# Patient Record
Sex: Female | Born: 1985 | Hispanic: Yes | Marital: Married | Smoking: Never smoker
Health system: Southern US, Community
[De-identification: ages and names within clinical notes are randomized; demographics above are authoritative.]

## PROBLEM LIST (undated history)

## (undated) DIAGNOSIS — N390 Urinary tract infection, site not specified: Secondary | ICD-10-CM

## (undated) HISTORY — DX: Urinary tract infection, site not specified: N39.0

## (undated) HISTORY — PX: CHOLECYSTECTOMY: SHX55

---
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
## 2008-07-21 ENCOUNTER — Inpatient Hospital Stay: Payer: Self-pay | Admitting: Surgery

## 2009-09-09 ENCOUNTER — Ambulatory Visit: Payer: Self-pay | Admitting: Family Medicine

## 2009-09-15 ENCOUNTER — Encounter: Payer: Self-pay | Admitting: Obstetrics and Gynecology

## 2010-01-07 ENCOUNTER — Observation Stay: Payer: Self-pay | Admitting: Obstetrics and Gynecology

## 2010-02-05 ENCOUNTER — Inpatient Hospital Stay: Payer: Self-pay | Admitting: Obstetrics and Gynecology

## 2014-03-28 ENCOUNTER — Encounter: Payer: Self-pay | Admitting: Maternal & Fetal Medicine

## 2014-04-05 NOTE — L&D Delivery Note (Signed)
CTSP and NSVD of viable female in OA position with no CAN. Vtx, Ant and Post shoulder delivered over perineum and to mom's abd. at 0553. Baby cried initially. No  Laceration identified. CCx2 and cut per FOB. Cord blood obtained. SDOP intact at 0600.  Am/pm. Apgars  8-9  . EBL 100  Ml's. VSS. Hemostasis achieved.  Needle and sponge ct correct. Bonding with baby and skin to skin initially.

## 2014-05-06 ENCOUNTER — Encounter: Payer: Self-pay | Admitting: Obstetrics and Gynecology

## 2014-07-07 ENCOUNTER — Observation Stay
Admit: 2014-07-07 | Disposition: A | Discharge status: Home / Self Care | Payer: Self-pay | Attending: Obstetrics and Gynecology | Admitting: Obstetrics and Gynecology

## 2014-07-07 LAB — COMPREHENSIVE METABOLIC PANEL
Albumin: 3.6 g/dL
Alkaline Phosphatase: 71 U/L
Anion Gap: 11 (ref 7–16)
BUN: 7 mg/dL
Bilirubin,Total: 0.6 mg/dL
CALCIUM: 8.4 mg/dL — AB
Chloride: 105 mmol/L
Co2: 20 mmol/L — ABNORMAL LOW
Creatinine: 0.44 mg/dL
GLUCOSE: 88 mg/dL
POTASSIUM: 2.8 mmol/L — AB
SGOT(AST): 26 U/L
SGPT (ALT): 28 U/L
SODIUM: 136 mmol/L
TOTAL PROTEIN: 6.9 g/dL

## 2014-07-07 LAB — URINALYSIS, COMPLETE
BILIRUBIN, UR: NEGATIVE
BLOOD: NEGATIVE
Glucose,UR: NEGATIVE mg/dL (ref 0–75)
LEUKOCYTE ESTERASE: NEGATIVE
Nitrite: NEGATIVE
PH: 8 (ref 4.5–8.0)
Protein: NEGATIVE
RBC,UR: NONE SEEN /HPF (ref 0–5)
SPECIFIC GRAVITY: 1.005 (ref 1.003–1.030)
Squamous Epithelial: 1
WBC UR: 1 /HPF (ref 0–5)

## 2014-07-07 LAB — CBC WITH DIFFERENTIAL/PLATELET
Basophil #: 0 10*3/uL (ref 0.0–0.1)
Basophil %: 0.2 %
EOS PCT: 0.2 %
Eosinophil #: 0 10*3/uL (ref 0.0–0.7)
HCT: 36 % (ref 35.0–47.0)
HGB: 12.4 g/dL (ref 12.0–16.0)
LYMPHS ABS: 0.9 10*3/uL — AB (ref 1.0–3.6)
Lymphocyte %: 10.9 %
MCH: 30.7 pg (ref 26.0–34.0)
MCHC: 34.4 g/dL (ref 32.0–36.0)
MCV: 89 fL (ref 80–100)
MONO ABS: 0.3 x10 3/mm (ref 0.2–0.9)
Monocyte %: 3.2 %
NEUTROS PCT: 85.5 %
Neutrophil #: 6.8 10*3/uL — ABNORMAL HIGH (ref 1.4–6.5)
Platelet: 169 10*3/uL (ref 150–440)
RBC: 4.03 10*6/uL (ref 3.80–5.20)
RDW: 14.5 % (ref 11.5–14.5)
WBC: 8 10*3/uL (ref 3.6–11.0)

## 2014-07-27 NOTE — Consult Note (Signed)
Referral Information:  Reason for Referral 29 yo gravida 3 para 2002 at 7w6dgestation by LMP and ultrasound today is referred for recommendations regarding a purported history of severe preeclampsia in her previous pregnancy.  The patient denies ever being told she had preeclampsia.   Referring Physician [facility]   Prenatal Hx First ultrasound today.  She met with the genetic counselor and declined first trimester screening   Past Obstetrical Hx 1. 01/05/2004 - spontaneous vaginal delivery 5 lb 13 oz female infant at 317weeks gestation - [facility] 2. 02/06/2010 - spontaneous vaginal delivery 6 lb 14 oz female infant at 363weeks gestation - [facility] - induced for severe inguinal pain;  A review of the delivery record reveals NO EVIDENCE OF PREECLAMPSIA   Home Medications: Medication Instructions Status  prenatal multivitamin 1   once a day  Active  ferrous sulfate 1 tab(s)  once a day for 6 weeks Active   Allergies:   No Known Allergies:   Vital Signs/Notes:  Nursing Vital Signs: **Vital Signs.:   24-Dec-15 08:52  Vital Signs Type Routine  Temperature Temperature (F) 98.6  Celsius 37  Temperature Source oral  Pulse Pulse 91  Respirations Respirations 18  Systolic BP Systolic BP 1161 Diastolic BP (mmHg) Diastolic BP (mmHg) 76  Mean BP 93  Pulse Ox % Pulse Ox % 99  Pulse Ox Activity Level  At rest  Oxygen Delivery Room Air/ 21 %   Perinatal Consult:  LMP 28-Dec-2013   PGyn Hx Benign   PMed Hx Rubella Equivocal   Past Medical History cont'd Benign   PSurg Hx Choleceystectomy 2008   FHx Noncontributory   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  Fetal Anatomy:  Skull / Brain: appears normal. Spine: appears normal. Heart: appears normal.  Abdomen: appears normal. Stomach: visible. Bladder: visible. Hands: both  visible. Feet: both visible.     ____________________________________________________________________________  Maternal Structures:  Uterus and adnexa normal.   Uterus: normal.  Cervix: normal.  Right Ovary: normal.   Left Ovary: normal.  ____________________________________________________________________________  Report Summary:  Impression: Single intrauterine pregnancy with an estimated gestational age of   144 weeks6 days.  Dating assigned by LMP concordant with today's  ultrasound.    The patietn was. CODING DESCRIPTION:   Recommendations:     Thank you for allowing [place] participate in her care.  Electronically signed by:  AErasmo Score MD   Impression/Recommendations:  Impression 29yo gravida 3 para 2002 at 140w6destation by LMP and ultrasound today is referred for recommendations regarding a purported history of severe preeclampsia in her previous pregnancy.  The patient denies ever being told she had preeclampsia. There is no evidence from her previous delivery of preeclampsia   Recommendations 1. No special monitoring this pregnancy 2. Low-dose ASA would be optional in a low-risk pregnancy 3. The patient was scheduled to return  in 6 weeks for anatomy ultrasound.   Plan:  Genetic Counseling yes    Comments Thank you for allowing [place] to participate in her care.    Total Time Spent with Patient 30 minutes   >50% of visit spent in couseling/coordination of care yes   Office Use Only [id number]  Level 2 (60mn) NEW office consult exp prob focused   Coding Description: MATERNAL CONDITIONS/HISTORY INDICATION(S).   OTHER:  severe inguinal pain in a previous pregnancy.  Electronic Signatures: JDellia Nims(MD)  (Signed 24-Dec-15 13:18)  Authored: Referral, Home Medications, Allergies, Vital Signs/Notes, Consult, Exam, Radiology, Impression, Plan, Other Comments, Billing, Coding Description   Last Updated: 24-Dec-15 13:18 by JDellia Nims(MD)

## 2014-09-30 ENCOUNTER — Inpatient Hospital Stay
Admission: EM | Admit: 2014-09-30 | Discharge: 2014-10-02 | DRG: 775 | Disposition: A | Discharge status: Home / Self Care | Payer: Medicaid Other | Attending: Obstetrics and Gynecology | Admitting: Obstetrics and Gynecology

## 2014-09-30 DIAGNOSIS — Z3A39 39 weeks gestation of pregnancy: Secondary | ICD-10-CM | POA: Diagnosis present

## 2014-09-30 NOTE — H&P (Signed)
Jenna Daniel is a 29 y.o. female presenting for labor S/S. No ROM, no VB, no decreased FM. PNC at [facility] significant for LMP of 10/04/14 & EDD of 10/04/14  Maternal Medical History:  Reason for admission: Contractions.   Contractions: Onset was 3-5 hours ago.    Fetal activity: Perceived fetal activity is normal.   Last perceived fetal movement was within the past hour.    Prenatal complications: No bleeding, cholelithiasis, PIH, infection, IUGR, nephrolithiasis, oligohydramnios, placental abnormality, polyhydramnios, pre-eclampsia, preterm labor, substance abuse, thrombocytopenia or thrombophilia.   Prenatal Complications - Diabetes: none.    OB History    No data available     PMH: UTI,  BMI 28.6  [facility]  Family History: TB, pulmonary disorders, diabetes, cancer, multiple births, DM: MGM-Type 2, Cancer MGM.Marland Kitchen. borther with asthma, TB in family.  Social History:  has no tobacco, alcohol, and drug history on file.   Prenatal Transfer Tool  Maternal Diabetes: No Genetic Screening: none found on chart Maternal Ultrasounds/Referrals: Normal Fetal Ultrasounds or other Referrals:  None Maternal Substance Abuse:  No Significant Maternal Medications:  None Significant Maternal Lab Results:  None Other Comments:  GBs neg  ROS  Benign  9  Dilation: 4 Effacement (%): 80 Station: -2 Exam by:: T.Roberts RN Height 5\' 4"  (1.626 m), weight 81.647 kg (180 lb). Exam Physical Exam  Prenatal labs: ABO, Rh:  B pos Antibody:  Neg Rubella:  Immune RPR:   Non-REactive HBsAg:   Neg HIV:   Neg GBS:   neg Varicella: Non-immune  Assessment/Plan: A: 1. IUP at 39 5/7 weeks 2. GBS neg P:1. Admit for Labor.  2. GBS neg.   3. Monitor UC/FHT  Jenna Daniel, Jenna Daniel 09/30/2014, 11:43 PM

## 2014-10-01 ENCOUNTER — Other Ambulatory Visit: Payer: Self-pay | Admitting: Obstetrics and Gynecology

## 2014-10-01 DIAGNOSIS — Z3A39 39 weeks gestation of pregnancy: Secondary | ICD-10-CM | POA: Diagnosis present

## 2014-10-01 DIAGNOSIS — T7840XA Allergy, unspecified, initial encounter: Secondary | ICD-10-CM | POA: Diagnosis present

## 2014-10-01 DIAGNOSIS — Y9289 Other specified places as the place of occurrence of the external cause: Secondary | ICD-10-CM | POA: Diagnosis not present

## 2014-10-01 DIAGNOSIS — Z79899 Other long term (current) drug therapy: Secondary | ICD-10-CM | POA: Diagnosis not present

## 2014-10-01 DIAGNOSIS — Y998 Other external cause status: Secondary | ICD-10-CM | POA: Diagnosis not present

## 2014-10-01 DIAGNOSIS — Y9389 Activity, other specified: Secondary | ICD-10-CM | POA: Diagnosis not present

## 2014-10-01 DIAGNOSIS — T50B95A Adverse effect of other viral vaccines, initial encounter: Secondary | ICD-10-CM | POA: Diagnosis not present

## 2014-10-01 LAB — TYPE AND SCREEN
ABO/RH(D): B POS
ANTIBODY SCREEN: NEGATIVE

## 2014-10-01 LAB — CBC
HEMATOCRIT: 36.2 % (ref 35.0–47.0)
Hemoglobin: 12.1 g/dL (ref 12.0–16.0)
MCH: 27.9 pg (ref 26.0–34.0)
MCHC: 33.6 g/dL (ref 32.0–36.0)
MCV: 83.1 fL (ref 80.0–100.0)
Platelets: 200 10*3/uL (ref 150–440)
RBC: 4.35 MIL/uL (ref 3.80–5.20)
RDW: 15.4 % — ABNORMAL HIGH (ref 11.5–14.5)
WBC: 9.2 10*3/uL (ref 3.6–11.0)

## 2014-10-01 MED ORDER — FLEET ENEMA 7-19 GM/118ML RE ENEM: 1.0000 | ENEMA | Freq: Every day | RECTAL | Status: DC | PRN

## 2014-10-01 MED ORDER — SODIUM CHLORIDE 0.9 % IJ SOLN: 3.0000 mL | INTRAMUSCULAR | Status: DC | PRN

## 2014-10-01 MED ORDER — SODIUM CHLORIDE 0.9 % IJ SOLN: 3.0000 mL | Freq: Two times a day (BID) | INTRAMUSCULAR | Status: DC

## 2014-10-01 MED ORDER — DIBUCAINE 1 % RE OINT: 1.0000 "application " | TOPICAL_OINTMENT | RECTAL | Status: DC | PRN

## 2014-10-01 MED ORDER — CITRIC ACID-SODIUM CITRATE 334-500 MG/5ML PO SOLN: 30.0000 mL | ORAL | Status: DC | PRN

## 2014-10-01 MED ORDER — DIPHENHYDRAMINE HCL 25 MG PO CAPS: 25.0000 mg | ORAL_CAPSULE | Freq: Four times a day (QID) | ORAL | Status: DC | PRN

## 2014-10-01 MED ORDER — SIMETHICONE 80 MG PO CHEW: 80.0000 mg | CHEWABLE_TABLET | ORAL | Status: DC | PRN

## 2014-10-01 MED ORDER — WITCH HAZEL-GLYCERIN EX PADS: 1.0000 "application " | MEDICATED_PAD | CUTANEOUS | Status: DC | PRN

## 2014-10-01 MED ORDER — AMMONIA AROMATIC IN INHA
RESPIRATORY_TRACT | Status: AC
  Filled 2014-10-01: qty 10

## 2014-10-01 MED ORDER — LACTATED RINGERS IV SOLN
INTRAVENOUS | Status: DC
  Administered 2014-10-01: 01:00:00 via INTRAVENOUS

## 2014-10-01 MED ORDER — LIDOCAINE HCL (PF) 1 % IJ SOLN
30.0000 mL | INTRAMUSCULAR | Status: DC | PRN
  Filled 2014-10-01: qty 30

## 2014-10-01 MED ORDER — LIDOCAINE HCL (PF) 1 % IJ SOLN
INTRAMUSCULAR | Status: AC
  Filled 2014-10-01: qty 30

## 2014-10-01 MED ORDER — OXYTOCIN 10 UNIT/ML IJ SOLN
INTRAMUSCULAR | Status: AC
  Filled 2014-10-01: qty 2

## 2014-10-01 MED ORDER — ZOLPIDEM TARTRATE 5 MG PO TABS: 5.0000 mg | ORAL_TABLET | Freq: Every evening | ORAL | Status: DC | PRN

## 2014-10-01 MED ORDER — IBUPROFEN 600 MG PO TABS
600.0000 mg | ORAL_TABLET | Freq: Four times a day (QID) | ORAL | Status: DC
  Administered 2014-10-01 – 2014-10-02 (×4): 600 mg via ORAL
  Filled 2014-10-01 (×4): qty 1

## 2014-10-01 MED ORDER — BUTORPHANOL TARTRATE 1 MG/ML IJ SOLN
1.0000 mg | INTRAMUSCULAR | Status: DC | PRN
  Administered 2014-10-01: 1 mg via INTRAVENOUS

## 2014-10-01 MED ORDER — OXYCODONE-ACETAMINOPHEN 5-325 MG PO TABS
2.0000 | ORAL_TABLET | ORAL | Status: DC | PRN
  Filled 2014-10-01: qty 2

## 2014-10-01 MED ORDER — BISACODYL 10 MG RE SUPP: 10.0000 mg | Freq: Every day | RECTAL | Status: DC | PRN

## 2014-10-01 MED ORDER — OXYTOCIN BOLUS FROM INFUSION: 500.0000 mL | INTRAVENOUS | Status: DC

## 2014-10-01 MED ORDER — BUTORPHANOL TARTRATE 1 MG/ML IJ SOLN
INTRAMUSCULAR | Status: AC
  Administered 2014-10-01: 1 mg via INTRAVENOUS
  Filled 2014-10-01: qty 1

## 2014-10-01 MED ORDER — ONDANSETRON HCL 4 MG/2ML IJ SOLN: 4.0000 mg | Freq: Four times a day (QID) | INTRAMUSCULAR | Status: DC | PRN

## 2014-10-01 MED ORDER — OXYTOCIN 40 UNITS IN LACTATED RINGERS INFUSION - SIMPLE MED
INTRAVENOUS | Status: AC
  Filled 2014-10-01: qty 1000

## 2014-10-01 MED ORDER — LANOLIN HYDROUS EX OINT: TOPICAL_OINTMENT | CUTANEOUS | Status: DC | PRN

## 2014-10-01 MED ORDER — BUTORPHANOL TARTRATE 1 MG/ML IJ SOLN
1.0000 mg | INTRAMUSCULAR | Status: DC | PRN
  Administered 2014-10-01: 2 mg via INTRAVENOUS

## 2014-10-01 MED ORDER — OXYCODONE-ACETAMINOPHEN 5-325 MG PO TABS: 1.0000 | ORAL_TABLET | ORAL | Status: DC | PRN

## 2014-10-01 MED ORDER — ACETAMINOPHEN 325 MG PO TABS: 650.0000 mg | ORAL_TABLET | ORAL | Status: DC | PRN

## 2014-10-01 MED ORDER — ONDANSETRON HCL 4 MG/2ML IJ SOLN: 4.0000 mg | INTRAMUSCULAR | Status: DC | PRN

## 2014-10-01 MED ORDER — LACTATED RINGERS IV SOLN: 500.0000 mL | INTRAVENOUS | Status: DC | PRN

## 2014-10-01 MED ORDER — SODIUM CHLORIDE 0.9 % IV SOLN: 250.0000 mL | INTRAVENOUS | Status: DC | PRN

## 2014-10-01 MED ORDER — OXYCODONE-ACETAMINOPHEN 5-325 MG PO TABS
2.0000 | ORAL_TABLET | ORAL | Status: DC | PRN
Start: 2014-10-01 — End: 2014-10-01

## 2014-10-01 MED ORDER — BENZOCAINE-MENTHOL 20-0.5 % EX AERO: 1.0000 "application " | INHALATION_SPRAY | CUTANEOUS | Status: DC | PRN

## 2014-10-01 MED ORDER — BUTORPHANOL TARTRATE 1 MG/ML IJ SOLN
INTRAMUSCULAR | Status: AC
  Administered 2014-10-01: 2 mg via INTRAVENOUS
  Filled 2014-10-01: qty 2

## 2014-10-01 MED ORDER — ONDANSETRON HCL 4 MG PO TABS: 4.0000 mg | ORAL_TABLET | ORAL | Status: DC | PRN

## 2014-10-01 MED ORDER — OXYCODONE-ACETAMINOPHEN 5-325 MG PO TABS
1.0000 | ORAL_TABLET | ORAL | Status: DC | PRN
  Administered 2014-10-01: 1 via ORAL

## 2014-10-01 MED ORDER — VARICELLA VIRUS VACCINE LIVE 1350 PFU/0.5ML IJ SUSR
0.5000 mL | Freq: Once | INTRAMUSCULAR | Status: AC
  Administered 2014-10-02: 0.5 mL via SUBCUTANEOUS
  Filled 2014-10-01: qty 0.5

## 2014-10-01 MED ORDER — OXYTOCIN 40 UNITS IN LACTATED RINGERS INFUSION - SIMPLE MED: 62.5000 mL/h | INTRAVENOUS | Status: DC | PRN

## 2014-10-01 MED ORDER — SENNOSIDES-DOCUSATE SODIUM 8.6-50 MG PO TABS
2.0000 | ORAL_TABLET | ORAL | Status: DC
  Administered 2014-10-01: 2 via ORAL
  Filled 2014-10-01: qty 2

## 2014-10-01 MED ORDER — PRENATAL MULTIVITAMIN CH
1.0000 | ORAL_TABLET | Freq: Every day | ORAL | Status: DC
  Administered 2014-10-01: 1 via ORAL
  Filled 2014-10-01: qty 1

## 2014-10-01 MED ORDER — OXYTOCIN 40 UNITS IN LACTATED RINGERS INFUSION - SIMPLE MED: 62.5000 mL/h | INTRAVENOUS | Status: DC

## 2014-10-01 MED ORDER — MISOPROSTOL 200 MCG PO TABS
ORAL_TABLET | ORAL | Status: AC
  Filled 2014-10-01: qty 4

## 2014-10-02 ENCOUNTER — Emergency Department: Payer: Medicaid Other

## 2014-10-02 ENCOUNTER — Encounter: Payer: Self-pay | Admitting: *Deleted

## 2014-10-02 ENCOUNTER — Emergency Department
Admission: EM | Admit: 2014-10-02 | Discharge: 2014-10-02 | Disposition: A | Discharge status: Home / Self Care | Payer: Medicaid Other | Attending: Emergency Medicine | Admitting: Emergency Medicine

## 2014-10-02 DIAGNOSIS — T50B95A Adverse effect of other viral vaccines, initial encounter: Secondary | ICD-10-CM | POA: Insufficient documentation

## 2014-10-02 DIAGNOSIS — T7840XA Allergy, unspecified, initial encounter: Secondary | ICD-10-CM

## 2014-10-02 DIAGNOSIS — Y9289 Other specified places as the place of occurrence of the external cause: Secondary | ICD-10-CM | POA: Insufficient documentation

## 2014-10-02 DIAGNOSIS — Y998 Other external cause status: Secondary | ICD-10-CM | POA: Insufficient documentation

## 2014-10-02 DIAGNOSIS — Z79899 Other long term (current) drug therapy: Secondary | ICD-10-CM | POA: Insufficient documentation

## 2014-10-02 DIAGNOSIS — Y9389 Activity, other specified: Secondary | ICD-10-CM | POA: Insufficient documentation

## 2014-10-02 LAB — RPR: RPR: NONREACTIVE

## 2014-10-02 LAB — CBC
HEMATOCRIT: 35 % (ref 35.0–47.0)
HEMOGLOBIN: 11.7 g/dL — AB (ref 12.0–16.0)
MCH: 27.7 pg (ref 26.0–34.0)
MCHC: 33.4 g/dL (ref 32.0–36.0)
MCV: 83 fL (ref 80.0–100.0)
Platelets: 194 10*3/uL (ref 150–440)
RBC: 4.21 MIL/uL (ref 3.80–5.20)
RDW: 15.5 % — ABNORMAL HIGH (ref 11.5–14.5)
WBC: 9.4 10*3/uL (ref 3.6–11.0)

## 2014-10-02 LAB — HIV ANTIBODY (ROUTINE TESTING W REFLEX): HIV Screen 4th Generation wRfx: NONREACTIVE

## 2014-10-02 MED ORDER — IPRATROPIUM-ALBUTEROL 0.5-2.5 (3) MG/3ML IN SOLN
3.0000 mL | Freq: Once | RESPIRATORY_TRACT | Status: AC
  Administered 2014-10-02: 3 mL via RESPIRATORY_TRACT

## 2014-10-02 MED ORDER — VARICELLA VIRUS VACCINE LIVE 1350 PFU/0.5ML IJ SUSR: 0.5000 mL | Freq: Once | INTRAMUSCULAR | Status: DC

## 2014-10-02 MED ORDER — RANITIDINE HCL 150 MG PO TABS: 150.0000 mg | ORAL_TABLET | Freq: Two times a day (BID) | ORAL | Status: DC

## 2014-10-02 MED ORDER — IPRATROPIUM-ALBUTEROL 0.5-2.5 (3) MG/3ML IN SOLN
RESPIRATORY_TRACT | Status: AC
  Administered 2014-10-02: 3 mL via RESPIRATORY_TRACT
  Filled 2014-10-02: qty 3

## 2014-10-02 MED ORDER — BISACODYL 10 MG RE SUPP: 10.0000 mg | Freq: Every day | RECTAL | Status: DC | PRN

## 2014-10-02 MED ORDER — FAMOTIDINE 20 MG PO TABS
ORAL_TABLET | ORAL | Status: AC
  Administered 2014-10-02: 40 mg via ORAL
  Filled 2014-10-02: qty 2

## 2014-10-02 MED ORDER — DIPHENHYDRAMINE HCL 12.5 MG/5ML PO ELIX
50.0000 mg | ORAL_SOLUTION | Freq: Once | ORAL | Status: AC
  Administered 2014-10-02: 50 mg via ORAL

## 2014-10-02 MED ORDER — IBUPROFEN 600 MG PO TABS: 600.0000 mg | ORAL_TABLET | Freq: Four times a day (QID) | ORAL | Status: DC

## 2014-10-02 MED ORDER — IPRATROPIUM-ALBUTEROL 0.5-2.5 (3) MG/3ML IN SOLN: 3.0000 mL | Freq: Once | RESPIRATORY_TRACT | Status: AC

## 2014-10-02 MED ORDER — FAMOTIDINE 20 MG PO TABS
40.0000 mg | ORAL_TABLET | Freq: Once | ORAL | Status: AC
  Administered 2014-10-02: 40 mg via ORAL

## 2014-10-02 MED ORDER — DIPHENHYDRAMINE HCL 50 MG PO CAPS
ORAL_CAPSULE | ORAL | Status: AC
  Filled 2014-10-02: qty 1

## 2014-10-02 MED ORDER — DIPHENHYDRAMINE HCL 12.5 MG/5ML PO ELIX
ORAL_SOLUTION | ORAL | Status: AC
  Administered 2014-10-02: 50 mg via ORAL
  Filled 2014-10-02: qty 20

## 2014-10-02 MED ORDER — IPRATROPIUM-ALBUTEROL 0.5-2.5 (3) MG/3ML IN SOLN
RESPIRATORY_TRACT | Status: AC
  Filled 2014-10-02: qty 3

## 2014-10-02 NOTE — ED Notes (Signed)
Pt was discharged from L&D after having a vaccination for varicella, pt having shortness of breath and difficulty breathing after having the vaccine

## 2014-10-02 NOTE — Discharge Instructions (Signed)

## 2014-10-02 NOTE — Discharge Instructions (Signed)
Allergies °Allergies may happen from anything your body is sensitive to. This may be food, medicines, pollens, chemicals, and nearly anything around you in everyday life that produces allergens. An allergen is anything that causes an allergy producing substance. Heredity is often a factor in causing these problems. This means you may have some of the same allergies as your parents. °Food allergies happen in all age groups. Food allergies are some of the most severe and life threatening. Some common food allergies are cow's milk, seafood, eggs, nuts, wheat, and soybeans. °SYMPTOMS  °· Swelling around the mouth. °· An itchy red rash or hives. °· Vomiting or diarrhea. °· Difficulty breathing. °SEVERE ALLERGIC REACTIONS ARE LIFE-THREATENING. °This reaction is called anaphylaxis. It can cause the mouth and throat to swell and cause difficulty with breathing and swallowing. In severe reactions only a trace amount of food (for example, peanut oil in a salad) may cause death within seconds. °Seasonal allergies occur in all age groups. These are seasonal because they usually occur during the same season every year. They may be a reaction to molds, grass pollens, or tree pollens. Other causes of problems are house dust mite allergens, pet dander, and mold spores. The symptoms often consist of nasal congestion, a runny itchy nose associated with sneezing, and tearing itchy eyes. There is often an associated itching of the mouth and ears. The problems happen when you come in contact with pollens and other allergens. Allergens are the particles in the air that the body reacts to with an allergic reaction. This causes you to release allergic antibodies. Through a chain of events, these eventually cause you to release histamine into the blood stream. Although it is meant to be protective to the body, it is this release that causes your discomfort. This is why you were given anti-histamines to feel better.  If you are unable to  pinpoint the offending allergen, it may be determined by skin or blood testing. Allergies cannot be cured but can be controlled with medicine. °Hay fever is a collection of all or some of the seasonal allergy problems. It may often be treated with simple over-the-counter medicine such as diphenhydramine. Take medicine as directed. Do not drink alcohol or drive while taking this medicine. Check with your caregiver or package insert for child dosages. °If these medicines are not effective, there are many new medicines your caregiver can prescribe. Stronger medicine such as nasal spray, eye drops, and corticosteroids may be used if the first things you try do not work well. Other treatments such as immunotherapy or desensitizing injections can be used if all else fails. Follow up with your caregiver if problems continue. These seasonal allergies are usually not life threatening. They are generally more of a nuisance that can often be handled using medicine. °HOME CARE INSTRUCTIONS  °· If unsure what causes a reaction, keep a diary of foods eaten and symptoms that follow. Avoid foods that cause reactions. °· If hives or rash are present: °¨ Take medicine as directed. °¨ You may use an over-the-counter antihistamine (diphenhydramine) for hives and itching as needed. °¨ Apply cold compresses (cloths) to the skin or take baths in cool water. Avoid hot baths or showers. Heat will make a rash and itching worse. °· If you are severely allergic: °¨ Following a treatment for a severe reaction, hospitalization is often required for closer follow-up. °¨ Wear a medic-alert bracelet or necklace stating the allergy. °¨ You and your family must learn how to give adrenaline or use   an anaphylaxis kit.  If you have had a severe reaction, always carry your anaphylaxis kit or EpiPen with you. Use this medicine as directed by your caregiver if a severe reaction is occurring. Failure to do so could have a fatal outcome. SEEK MEDICAL  CARE IF:  You suspect a food allergy. Symptoms generally happen within 30 minutes of eating a food.  Your symptoms have not gone away within 2 days or are getting worse.  You develop new symptoms.  You want to retest yourself or your child with a food or drink you think causes an allergic reaction. Never do this if an anaphylactic reaction to that food or drink has happened before. Only do this under the care of a caregiver. SEEK IMMEDIATE MEDICAL CARE IF:   You have difficulty breathing, are wheezing, or have a tight feeling in your chest or throat.  You have a swollen mouth, or you have hives, swelling, or itching all over your body.  You have had a severe reaction that has responded to your anaphylaxis kit or an EpiPen. These reactions may return when the medicine has worn off. These reactions should be considered life threatening. MAKE SURE YOU:   Understand these instructions.  Will watch your condition.  Will get help right away if you are not doing well or get worse. Document Released: 06/15/2002 Document Revised: 07/17/2012 Document Reviewed: 11/20/2007 [facility] Patient Information 2015 [place], [place]. This information is not intended to replace advice given to you by your health care provider. Make sure you discuss any questions you have with your health care provider.  Please take 50 mg of benedryl 4 x a day for the next 2 days (thqt is 2 of the over the counter pills 4 x a day) be careful they may make you a little sleepy. Take the zantac pills 1 pill 2 x a day for 2 days. Use the albuterol inhaler  2 pufffs 4 x a day if needed. Call 911 and return if you get short af breath again or become very itchy again.

## 2014-10-02 NOTE — ED Notes (Signed)
Patient transported to CX[facility].

## 2014-10-02 NOTE — ED Provider Notes (Signed)
[facility] Emergency Department Provider Note   Time seen: Approximately 2:07 PM  I have reviewed the triage vital signs and the nursing notes.   HISTORY  Chief Complaint Allergic Reaction    HPI Jenna Daniel is a 29 y.o. female Patient just delivered, got varicella vaccine in the left upper outer arm just prior to discharge was discharged when she got down to the car the guy became itchy and wheezy and developed a stuffy nose. Patient came to the emergency room. Symptoms just started she has never had anything like this before has not had a history of allergies to any previous vaccination. Discussed with Dr. Feliberto GottronSchermerhorn the ability to continue breast-feeding using diphenhydramine H2 blockers and/or steroid-induced she said they're all compatible with breast-feeding.   History reviewed. No pertinent past medical history.  Patient Active Problem List   Diagnosis Date Noted  . Indication for care or intervention related to labor and delivery 10/01/2014  . Indication for care in labor or delivery 09/30/2014    History reviewed. No pertinent past surgical history.  Current Outpatient Rx  Name  Route  Sig  Dispense  Refill  . bisacodyl (DULCOLAX) 10 MG suppository   Rectal   Place 1 suppository (10 mg total) rectally daily as needed for moderate constipation.   12 suppository   0   . ibuprofen (ADVIL,MOTRIN) 600 MG tablet   Oral   Take 1 tablet (600 mg total) by mouth every 6 (six) hours.   30 tablet   0   . ranitidine (ZANTAC) 150 MG tablet   Oral   Take 1 tablet (150 mg total) by mouth 2 (two) times daily.   4 tablet   0   . varicella virus vaccine live (VARIVAX) 1350 PFU/0.5ML INJ injection   Subcutaneous   Inject 0.5 mLs into the skin once.   1 each   0     Allergies Review of patient's allergies indicates no known allergies.  No family history on file.  Social History History  Substance Use Topics  . Smoking status: Never  Smoker   . Smokeless tobacco: Not on file  . Alcohol Use: No    Review of Systems Constitutional: No fever/chills Eyes: No visual changes. ENT: No sore throat. Cardiovascular: Denies chest pain. Respiratory: see history of present illness Gastrointestinal: No abdominal pain.  No nausea, no vomiting.  No diarrhea.  No constipation. Genitourinary: Negative for dysuria. Musculoskeletal: Negative for back pain. Skin: patient has a fine for rash scattered over her arms and legs is she reports just started within the last 1520 minutes she shot was only received about a half an hour ago Neurological: Negative for headaches, focal weakness or numbness.  10-point ROS otherwise negative.  ____________________________________________   PHYSICAL EXAM:  VITAL SIGNS: ED Triage Vitals  Enc Vitals Group     BP 10/02/14 1350 124/72 mmHg     Pulse Rate 10/02/14 1350 78     Resp --      Temp 10/02/14 1350 98 F (36.7 C)     Temp Source 10/02/14 1350 Oral     SpO2 10/02/14 1350 98 %     Weight 10/02/14 1350 170 lb (77.111 kg)     Height 10/02/14 1350 5\' 3"  (1.6 m)     Head Cir --      Peak Flow --      Pain Score --      Pain Loc --      Pain  Edu? --      Excl. in GC? --     Constitutional: Alert and oriented. Well appearing and in no acute distress. Eyes: Conjunctivae are normal. PERRL. EOMI. Head: Atraumatic. Nose: is congested Mouth/Throat: Mucous membranes are moist.  Oropharynx non-erythematous. Neck: No stridor.  Cardiovascular: Normal rate, regular rhythm. Grossly normal heart sounds.  Good peripheral circulation. Respiratory: Normal respiratory effort.  Patient isn't wheezing. Gastrointestinal: Soft and nontender. No distention. No abdominal bruits. No CVA tenderness. Musculoskeletal: No lower extremity tenderness nor edema.  No joint effusions. Neurologic:  Normal speech and language. No gross focal neurologic deficits are appreciated. Speech is normal. No gait  instability. Skin:  Skin is warm, dry and intact. Rash as described in history of present illness Psychiatric: Mood and affect are normal. Speech and behavior are normal.  ____________________________________________   LABS (all labs ordered are listed, but only abnormal results are displayed)  ____________________________________________  EKG   ____________________________________________  RADIOLOGY   ___chest x-ray read as normal by radiologist_________________________________________   PROCEDURES  Procedure(s) performed:  Critical Care performed:  ____________________________________________   INITIAL IMPRESSION / ASSESSMENT AND PLAN / ED COURSE  Pertinent labs & imaging results that were available during my care of the patient were reviewed by me and considered in my medical decision making (see chart for details).  __after Benadryl and Pepcid and the albuterol nebs patient's lungs are clear patient feels much better she had some slight itching left I will discharge her with by mouth Benadryl and Zantac I will not give first her records at this point since she had the vaccine and since she also has a baby with her. I will give her an albuterol inhaler patient to return immediately by calling 911 if shortness of breath recurs or if she gets much worse again with the itching__________________________________________   FINAL CLINICAL IMPRESSION(S) / ED DIAGNOSES  Final diagnoses:  Allergic reaction, initial encounter      Arnaldo Natal, MD 10/02/14 2113

## 2014-10-02 NOTE — ED Notes (Signed)
Pt reports improvement in breathing with neb. 

## 2014-10-02 NOTE — ED Notes (Signed)
AAOx3.  Skin warm and dry.  Respirations regular and non labored.  Lungs are clear.  Patient states itching is much improved.  NAD.

## 2014-10-02 NOTE — ED Notes (Signed)
Dr Darnelle CatalanMalinda notified of difficulty breathing. At bedside to assess.

## 2014-10-02 NOTE — Discharge Summary (Signed)
Obstetric Discharge Summary Reason for Admission: onset of labor Prenatal Procedures: none Intrapartum Procedures: spontaneous vaginal delivery Postpartum Procedures: none Complications-Operative and Postpartum: none HEMOGLOBIN  Date Value Ref Range Status  10/02/2014 11.7* 12.0 - 16.0 g/dL Final   HGB  Date Value Ref Range Status  07/07/2014 12.4 12.0-16.0 g/dL Final   HCT  Date Value Ref Range Status  10/02/2014 35.0 35.0 - 47.0 % Final  07/07/2014 36.0 35.0-47.0 % Final    Physical Exam:  General: alert and cooperative Lochia: appropriate Uterine Fundus: firm Incision:  DVT Evaluation: No evidence of DVT seen on physical exam.  Discharge Diagnoses: Term Pregnancy-delivered  Discharge Information: Date: 10/02/2014 Activity: pelvic rest Diet: routine Medications: Ibuprofen Condition: stable Instructions: Discharge to: home Follow-up Information    [facility] weeks.   Why:  postpartum care   Contact information:   [address] [place] [id number] [phone]       Newborn Data: Live born female  Birth Weight:   APGAR: 8, 9  Home with mother. Varicella vaccine given before d/c  Jenna Daniel 10/02/2014, 10:49 AM

## 2014-10-02 NOTE — ED Notes (Signed)
Patient stable on d/c 

## 2018-04-11 ENCOUNTER — Other Ambulatory Visit: Payer: Self-pay

## 2018-04-11 ENCOUNTER — Encounter: Payer: Self-pay | Admitting: Emergency Medicine

## 2018-04-11 ENCOUNTER — Emergency Department
Admission: EM | Admit: 2018-04-11 | Discharge: 2018-04-11 | Disposition: A | Discharge status: Home / Self Care | Payer: Self-pay | Attending: Emergency Medicine | Admitting: Emergency Medicine

## 2018-04-11 ENCOUNTER — Emergency Department: Payer: Self-pay

## 2018-04-11 DIAGNOSIS — B9689 Other specified bacterial agents as the cause of diseases classified elsewhere: Secondary | ICD-10-CM | POA: Insufficient documentation

## 2018-04-11 DIAGNOSIS — Z3A11 11 weeks gestation of pregnancy: Secondary | ICD-10-CM | POA: Insufficient documentation

## 2018-04-11 DIAGNOSIS — Z79899 Other long term (current) drug therapy: Secondary | ICD-10-CM | POA: Insufficient documentation

## 2018-04-11 DIAGNOSIS — O4690 Antepartum hemorrhage, unspecified, unspecified trimester: Secondary | ICD-10-CM

## 2018-04-11 DIAGNOSIS — O26891 Other specified pregnancy related conditions, first trimester: Secondary | ICD-10-CM | POA: Insufficient documentation

## 2018-04-11 DIAGNOSIS — N760 Acute vaginitis: Secondary | ICD-10-CM

## 2018-04-11 DIAGNOSIS — R102 Pelvic and perineal pain: Secondary | ICD-10-CM | POA: Insufficient documentation

## 2018-04-11 DIAGNOSIS — O23591 Infection of other part of genital tract in pregnancy, first trimester: Secondary | ICD-10-CM | POA: Insufficient documentation

## 2018-04-11 DIAGNOSIS — O4691 Antepartum hemorrhage, unspecified, first trimester: Secondary | ICD-10-CM | POA: Insufficient documentation

## 2018-04-11 LAB — WET PREP, GENITAL
SPERM: NONE SEEN
Trich, Wet Prep: NONE SEEN
Yeast Wet Prep HPF POC: NONE SEEN

## 2018-04-11 LAB — HCG, QUANTITATIVE, PREGNANCY: hCG, Beta Chain, Quant, S: 95305 m[IU]/mL — ABNORMAL HIGH (ref <5)

## 2018-04-11 LAB — CHLAMYDIA/NGC RT PCR (ARMC ONLY)
CHLAMYDIA TR: NOT DETECTED
N gonorrhoeae: NOT DETECTED

## 2018-04-11 MED ORDER — METRONIDAZOLE 500 MG PO TABS: 500.0000 mg | ORAL_TABLET | Freq: Two times a day (BID) | ORAL | 0 refills | Status: AC

## 2018-04-11 NOTE — ED Provider Notes (Signed)
[facility] Emergency Department Provider Note   First MD Initiated Contact with Patient 04/11/18 1833     (approximate)  I have reviewed the triage vital signs and the nursing notes.   HISTORY  Chief Complaint Vaginal Bleeding    HPI Jenna Daniel is a 33 y.o. female G4 para 311 weeks pregnant presents to the emergency department with pelvic cramping and vaginal spotting which began today.  Patient has not received any prenatal care to date.  Patient denies any urinary symptoms.  Patient denies any nausea or vomiting.   History reviewed. No pertinent past medical history.  Patient Active Problem List   Diagnosis Date Noted  . Indication for care or intervention related to labor and delivery 10/01/2014  . Indication for care in labor or delivery 09/30/2014      Prior to Admission medications   Medication Sig Start Date End Date Taking? Authorizing Provider  bisacodyl (DULCOLAX) 10 MG suppository Place 1 suppository (10 mg total) rectally daily as needed for moderate constipation. 10/02/14   Schermerhorn, Ihor Austin, MD  ibuprofen (ADVIL,MOTRIN) 600 MG tablet Take 1 tablet (600 mg total) by mouth every 6 (six) hours. 10/02/14   Schermerhorn, Ihor Austin, MD  ranitidine (ZANTAC) 150 MG tablet Take 1 tablet (150 mg total) by mouth 2 (two) times daily. 10/02/14 10/02/15  Arnaldo Natal, MD  varicella virus vaccine live (VARIVAX) 1350 PFU/0.5ML INJ injection Inject 0.5 mLs into the skin once. 10/02/14   Schermerhorn, Ihor Austin, MD    Allergies No known drug allergies No family history on file.  Social History Social History   Tobacco Use  . Smoking status: Never Smoker  Substance Use Topics  . Alcohol use: No    Alcohol/week: 0.0 standard drinks  . Drug use: No    Review of Systems Constitutional: No fever/chills Eyes: No visual changes. ENT: No sore throat. Cardiovascular: Denies chest pain. Respiratory: Denies shortness of  breath. Gastrointestinal: Positive for pelvic pain and vaginal spotting. Genitourinary: Negative for dysuria. Musculoskeletal: Negative for neck pain.  Negative for back pain. Integumentary: Negative for rash. Neurological: Negative for headaches, focal weakness or numbness.   ____________________________________________   PHYSICAL EXAM:  VITAL SIGNS: ED Triage Vitals [04/11/18 1802]  Enc Vitals Group     BP 124/82     Pulse Rate 80     Resp 16     Temp 98.2 F (36.8 C)     Temp Source Oral     SpO2 100 %     Weight      Height 1.626 m (5\' 4" )     Head Circumference      Peak Flow      Pain Score 4     Pain Loc      Pain Edu?      Excl. in GC?     Constitutional: Alert and oriented. Well appearing and in no acute distress. Eyes: Conjunctivae are normal. PERRL. EOMI. Mouth/Throat: Mucous membranes are moist.  Oropharynx non-erythematous. Neck: No stridor.   Cardiovascular: Normal rate, regular rhythm. Good peripheral circulation. Grossly normal heart sounds. Respiratory: Normal respiratory effort.  No retractions. Lungs CTAB. Gastrointestinal: Soft and nontender. No distention.  Genitourinary: Scant vaginal bleeding brownish vaginal discharge Musculoskeletal: No lower extremity tenderness nor edema. No gross deformities of extremities. Neurologic:  Normal speech and language. No gross focal neurologic deficits are appreciated.  Skin:  Skin is warm, dry and intact. No rash noted. Psychiatric: Mood and affect are normal.  Speech and behavior are normal.  ____________________________________________   LABS (all labs ordered are listed, but only abnormal results are displayed)  Labs Reviewed  HCG, QUANTITATIVE, PREGNANCY - Abnormal; Notable for the following components:      Result Value   hCG, Beta Chain, Quant, S 95,305 (*)    All other components within normal limits  POC URINE PREG, ED   _____________  RADIOLOGY I, Bulpitt N Yarexi Pawlicki, personally viewed and  evaluated these images (plain radiographs) as part of my medical decision making, as well as reviewing the written report by the radiologist.  ED MD interpretation: Single live IUP 11 weeks 0 days noted on ultrasound per radiologist  Official radiology report(s): [place] Ob Comp Less 14 Wks  Result Date: 04/11/2018 CLINICAL DATA:  Pelvic pain and vaginal bleeding for 12 hours. EXAM: OBSTETRIC <14 WK ULTRASOUND TECHNIQUE: Transabdominal ultrasound was performed for evaluation of the gestation as well as the maternal uterus and adnexal regions. COMPARISON:  None. FINDINGS: Intrauterine gestational sac: Single Yolk sac:  Visualized. Embryo:  Visualized. Cardiac Activity: Visualized. Heart Rate: 171 bpm CRL:   41 mm   11 w 0 d                  US EDC: 10/31/2018 Subchorionic hemorrhage:  None visualized. Maternal uterus/adnexae: Normal appearance of both ovaries. No mass or abnormal free fluid identified. IMPRESSION: Single living IUP measuring 11 weeks 0 days, with US EDC of 10/31/2018. No significant maternal uterine or adnexal abnormality identified. Electronically Signed   By: Myles RosenthalJohn  Stahl M.D.   On: 04/11/2018 19:41     Procedures   ____________________________________________   INITIAL IMPRESSION / ASSESSMENT AND PLAN / ED COURSE  As part of my medical decision making, I reviewed the following data within the electronic MEDICAL RECORD NUMBER   33 year old female presenting with above-stated history and physical exam.  Ultrasound revealed single live intrauterine pregnancy 11 weeks 0 days.  Vaginal exam revealed evidence of BV with scant vaginal bleeding.  Patient's blood type is B+ as noted September 30, 2014.  Spoke with the patient at length regarding warning signs that warrant immediate return to the emergency department.  Patient advised to follow-up with Dr. Elesa MassedWard [facility] or health department as she plan to do for further outpatient follow-up ____________________________________________  FINAL CLINICAL  IMPRESSION(S) / ED DIAGNOSES  Final diagnoses:  Vaginal bleeding in pregnancy  BV (bacterial vaginosis)     MEDICATIONS GIVEN DURING THIS VISIT:  Medications - No data to display   ED Discharge Orders    None       Note:  This document was prepared using Dragon voice recognition software and may include unintentional dictation errors.    Darci CurrentBrown, [place] N, MD 04/12/18 2107

## 2018-04-11 NOTE — ED Notes (Signed)
Lab verified wet prep and Chylamydia has been received.

## 2018-04-11 NOTE — ED Notes (Signed)
Pt back from US

## 2018-04-11 NOTE — ED Triage Notes (Signed)
PT c/o light vaginal spotting all day with abd cramping. PT 11 wks preg. Pt has not began prenatal care as of this time. VSS

## 2018-04-14 LAB — OB RESULTS CONSOLE PLATELET COUNT: Platelets: 187

## 2018-04-14 LAB — HM PAP SMEAR: HM Pap smear: NEGATIVE

## 2018-04-14 LAB — OB RESULTS CONSOLE HIV ANTIBODY (ROUTINE TESTING): HIV: NONREACTIVE

## 2018-04-14 LAB — OB RESULTS CONSOLE HGB/HCT, BLOOD
HCT: 40 (ref 29–41)
Hemoglobin: 14.2

## 2018-04-15 LAB — OB RESULTS CONSOLE HEPATITIS B SURFACE ANTIGEN: Hepatitis B Surface Ag: NEGATIVE

## 2018-04-15 LAB — OB RESULTS CONSOLE RPR: RPR: NONREACTIVE

## 2018-04-15 LAB — OB RESULTS CONSOLE ABO/RH: RH Type: POSITIVE

## 2018-04-18 LAB — OB RESULTS CONSOLE GC/CHLAMYDIA
Chlamydia: NEGATIVE
Chlamydia: NEGATIVE
Gonorrhea: NEGATIVE
Gonorrhea: NEGATIVE

## 2018-08-01 LAB — OB RESULTS CONSOLE HIV ANTIBODY (ROUTINE TESTING): HIV: NONREACTIVE

## 2018-08-01 LAB — OB RESULTS CONSOLE HGB/HCT, BLOOD: Hemoglobin: 11.2

## 2018-08-02 LAB — OB RESULTS CONSOLE RPR: RPR: NONREACTIVE

## 2018-10-02 ENCOUNTER — Encounter: Payer: Self-pay | Admitting: Physician Assistant

## 2018-10-02 ENCOUNTER — Ambulatory Visit: Payer: Self-pay | Admitting: Physician Assistant

## 2018-10-02 ENCOUNTER — Other Ambulatory Visit: Payer: Self-pay

## 2018-10-02 DIAGNOSIS — Z6790 Unspecified blood type, Rh positive: Secondary | ICD-10-CM

## 2018-10-02 DIAGNOSIS — O99810 Abnormal glucose complicating pregnancy: Secondary | ICD-10-CM

## 2018-10-02 DIAGNOSIS — Z3480 Encounter for supervision of other normal pregnancy, unspecified trimester: Secondary | ICD-10-CM

## 2018-10-02 DIAGNOSIS — E663 Overweight: Secondary | ICD-10-CM

## 2018-10-02 DIAGNOSIS — O4400 Placenta previa specified as without hemorrhage, unspecified trimester: Secondary | ICD-10-CM

## 2018-10-02 NOTE — Progress Notes (Signed)
Negative responses to Covid-19 screening questions. Denies international travel for self or FOB since pregnant. Verified has [facility] contact card.

## 2018-10-05 ENCOUNTER — Encounter: Payer: Self-pay | Admitting: Physician Assistant

## 2018-10-05 DIAGNOSIS — Z3480 Encounter for supervision of other normal pregnancy, unspecified trimester: Secondary | ICD-10-CM | POA: Insufficient documentation

## 2018-10-05 DIAGNOSIS — Z6790 Unspecified blood type, Rh positive: Secondary | ICD-10-CM | POA: Insufficient documentation

## 2018-10-05 DIAGNOSIS — O99810 Abnormal glucose complicating pregnancy: Secondary | ICD-10-CM | POA: Insufficient documentation

## 2018-10-05 DIAGNOSIS — E663 Overweight: Secondary | ICD-10-CM | POA: Insufficient documentation

## 2018-10-05 DIAGNOSIS — O4400 Placenta previa specified as without hemorrhage, unspecified trimester: Secondary | ICD-10-CM | POA: Insufficient documentation

## 2018-10-05 NOTE — Progress Notes (Signed)
Denies HA. Had occ UC, last yesterday and mild. C/o frequent brief nosebleeds in last few weeks, using vaseline in nose. Enc to try frequent use of saline nose spray and add humidifier in room where she sleeps.

## 2018-10-09 ENCOUNTER — Ambulatory Visit: Payer: Self-pay | Admitting: Nurse Practitioner

## 2018-10-09 ENCOUNTER — Other Ambulatory Visit: Payer: Self-pay

## 2018-10-09 ENCOUNTER — Ambulatory Visit: Payer: Self-pay

## 2018-10-09 DIAGNOSIS — Z6790 Unspecified blood type, Rh positive: Secondary | ICD-10-CM

## 2018-10-09 DIAGNOSIS — Z3480 Encounter for supervision of other normal pregnancy, unspecified trimester: Secondary | ICD-10-CM

## 2018-10-09 DIAGNOSIS — O99810 Abnormal glucose complicating pregnancy: Secondary | ICD-10-CM

## 2018-10-09 DIAGNOSIS — O4400 Placenta previa specified as without hemorrhage, unspecified trimester: Secondary | ICD-10-CM

## 2018-10-09 NOTE — Progress Notes (Signed)
In for visit; 36 wk cultures self collected today Jenna Daniel

## 2018-10-09 NOTE — Progress Notes (Signed)
  PRENATAL VISIT NOTE  Subjective:  Jenna Daniel is a 33 y.o. [phone] at [redacted]w[redacted]d being seen today for ongoing prenatal care.  36 wk labs - collected. She is currently monitored for the following issues for this  pregnancy and has Supervision of other normal pregnancy, antepartum; Placenta previa antepartum; Abnormal glucose tolerance affecting pregnancy, antepartum; Blood type, Rh positive; and Overweight (BMI 25.0-29.9) on their problem list.  Patient reports headaches and occasional BH contractions.   .  .   . denies vomiting, abdominal pain, fussiness, diarrhea, cough and difficulty breathing leaking of fluid/ROM.   The following portions of the patient's history were reviewed and updated as appropriate: allergies, current medications, past family history, past medical history, past social history, past surgical history and problem list. Problem list updated.  Objective:   Vitals:   10/09/18 0920  BP: 101/69  Temp: (!) 97.4 F (36.3 C)  Weight: 188 lb (85.3 kg)    Fetal Status:           General:  Alert, oriented and cooperative. Patient is in no acute distress.  Skin: Skin is warm and dry. No rash noted.   Cardiovascular: Normal heart rate noted  Respiratory: Normal respiratory effort, no problems with respiration noted  Abdomen: Soft, gravid, appropriate for gestational age.        Pelvic:  client collected 36 wk labs        Extremities: Normal range of motion.     Mental Status: Normal mood and affect. Normal behavior. Normal judgment and thought content.   Assessment and Plan:  Pregnancy: [id number] at [redacted]w[redacted]d  1. Supervision of other normal pregnancy, antepartum Doing well  2. Abnormal glucose tolerance affecting pregnancy, antepartum Denies any S&S  Await 36 wks lab results  Preterm labor symptoms and general obstetric precautions including but not limited to vaginal bleeding, contractions, leaking of fluid and fetal movement were reviewed in detail with the  patient. Please refer to After Visit Summary for other counseling recommendations.  No follow-ups on file.  No future appointments.  Berniece Andreas, NP     Plan:     Follow up in 1 wk Await 36 wks lab results Client denies any additional significant concerns or questions at this time.

## 2018-10-11 LAB — CHLAMYDIA/GC NAA, CONFIRMATION
Chlamydia trachomatis, NAA: NEGATIVE
Neisseria gonorrhoeae, NAA: NEGATIVE

## 2018-10-11 LAB — STREP GP B NAA: Strep Gp B NAA: NEGATIVE

## 2018-10-12 DIAGNOSIS — O99810 Abnormal glucose complicating pregnancy: Secondary | ICD-10-CM

## 2018-10-12 DIAGNOSIS — Z6790 Unspecified blood type, Rh positive: Secondary | ICD-10-CM

## 2018-10-12 DIAGNOSIS — Z3480 Encounter for supervision of other normal pregnancy, unspecified trimester: Secondary | ICD-10-CM

## 2018-10-12 DIAGNOSIS — O4400 Placenta previa specified as without hemorrhage, unspecified trimester: Secondary | ICD-10-CM

## 2018-10-12 NOTE — Progress Notes (Signed)
Data abstracted into Overview and Plan.

## 2018-10-12 NOTE — Progress Notes (Signed)
PRENATAL VISIT NOTE  Subjective:  Jenna Daniel is a 33 y.o. G4P2003 at [redacted]w[redacted]d being seen today for ongoing prenatal care.  She is currently monitored for the following issues for this low-risk pregnancy and has Supervision of other normal pregnancy, antepartum; Placenta previa antepartum; Abnormal glucose tolerance affecting pregnancy, antepartum; Blood type, Rh positive; and Overweight (BMI 25.0-29.9) on their problem list.  Patient reports no complaints.  Contractions: Not present. Vag. Bleeding: None.  Movement: Present. denies vomiting, abdominal pain, fussiness, diarrhea, cough and difficulty breathing leaking of fluid/ROM.   The following portions of the patient's history were reviewed and updated as appropriate: allergies, current medications, past family history, past medical history, past social history, past surgical history and problem list. Problem list updated.  Objective:   Vitals:   10/09/18 0920  BP: 101/69  Temp: (!) 97.4 F (36.3 C)  Weight: 188 lb (85.3 kg)    Fetal Status: Fetal Heart Rate (bpm): 148 Fundal Height: 36 cm Movement: Present  Presentation: Vertex  General:  Alert, oriented and cooperative. Patient is in no acute distress.  Skin: Skin is warm and dry. No rash noted.   Cardiovascular: Normal heart rate noted  Respiratory: Normal respiratory effort, no problems with respiration noted  Abdomen: Soft, gravid, appropriate for gestational age.  Pain/Pressure: Absent     Pelvic: Cervical exam deferred        Extremities: Normal range of motion.  Edema: None  Mental Status: Normal mood and affect. Normal behavior. Normal judgment and thought content.   Assessment and Plan:  Pregnancy: G4P2003 at [redacted]w[redacted]d  1. Supervision of other normal pregnancy, antepartum Client collected - await results - Strep Gp B NAA - GC/Chlamydia Probe Amp  2. Abnormal glucose tolerance affecting pregnancy, antepartum Continue to monitor   Preterm labor symptoms and  general obstetric precautions including but not limited to vaginal bleeding, contractions, leaking of fluid and fetal movement were reviewed in detail with the patient. Please refer to After Visit Summary for other counseling recommendations.  Return in about 1 week (around 10/16/2018) for routine prenatal care.  Future Appointments  Date Time Provider [place]  10/24/2018 10:00 AM [facility] PROVIDER [facility] None    Berniece Andreas, NP    Subjective:  Jenna Daniel is a 33 y.o. female being seen today.  Patient has the following medical conditionshas Supervision of other normal pregnancy, antepartum; Placenta previa antepartum; Abnormal glucose tolerance affecting pregnancy, antepartum; Blood type, Rh positive; and Overweight (BMI 25.0-29.9) on their problem list.  Chief Complaint  Patient presents with  . Routine Prenatal Visit    Patient reports  HPI   See flowsheet for further details and programmatic requirements.    The following portions of the patient's history were reviewed and updated as appropriate: allergies, current medications, past family history, past medical history, past social history, past surgical history and problem list. Problem list updated.  Objective:   Vitals:   10/09/18 0920  BP: 101/69  Temp: (!) 97.4 F (36.3 C)  Weight: 188 lb (85.3 kg)    Physical Exam    Assessment and Plan:  Jenna Daniel is a 33 y.o. female presenting [facility] the [facility] Department   Return in about 1 week (around 10/16/2018) for routine prenatal care.  Future Appointments  Date Time Provider [place]  10/16/2018  2:00 PM [facility] PROVIDER [facility] None    Berniece Andreas, NP

## 2018-10-16 ENCOUNTER — Other Ambulatory Visit: Payer: Self-pay

## 2018-10-16 ENCOUNTER — Ambulatory Visit: Payer: Self-pay | Admitting: Nurse Practitioner

## 2018-10-16 DIAGNOSIS — Z6790 Unspecified blood type, Rh positive: Secondary | ICD-10-CM

## 2018-10-16 DIAGNOSIS — O4400 Placenta previa specified as without hemorrhage, unspecified trimester: Secondary | ICD-10-CM

## 2018-10-16 DIAGNOSIS — O99810 Abnormal glucose complicating pregnancy: Secondary | ICD-10-CM

## 2018-10-16 DIAGNOSIS — Z3480 Encounter for supervision of other normal pregnancy, unspecified trimester: Secondary | ICD-10-CM

## 2018-10-16 NOTE — Progress Notes (Signed)
Telephone call to patient at 54 6/7 for maternity revisit. Patient identity verified using 2 identifiers. Patient is at home and will be available for provider call for next 30-45 minutes. Patient states she does not have a BP cuff or scale at home. Patient scheduled to come to clinic for 1 week appointment. Patient states she has not been called by nutritionist.Aubri Gathright Wynelle Beckmann, RN

## 2018-10-23 NOTE — Progress Notes (Signed)
   TELEPHONE OBSTETRICS VISIT ENCOUNTER NOTE  I connected by telephone at home and verified that I am speaking with the correct person using two identifiers.   I discussed the limitations, risks, security and privacy concerns of performing an evaluation and management service by telephone and the availability of in person appointments. I also discussed with the patient that there may be a patient responsible charge related to this service. The patient expressed understanding and agreed to proceed.  Subjective:  Jenna Daniel is a 33 y.o. G4P2003 at [redacted]w[redacted]d being followed for ongoing prenatal care.  She is currently monitored for the following issues for this high-risk pregnancy and has Supervision of other normal pregnancy, antepartum; Placenta previa antepartum; Abnormal glucose tolerance affecting pregnancy, antepartum; Blood type, Rh positive; and Overweight (BMI 25.0-29.9) on their problem list.  Patient reports no complaints. Reports fetal movement. Denies any contractions, bleeding or leaking of fluid.   The following portions of the patient's history were reviewed and updated as appropriate: allergies, current medications, past family history, past medical history, past social history, past surgical history and problem list.   Objective:   General:  Alert, oriented and cooperative.   Mental Status: Normal mood and affect perceived. Normal judgment and thought content.  Rest of physical exam deferred due to type of encounter  Assessment and Plan:  Pregnancy: G4P2003 at [redacted]w[redacted]d 1. Supervision of other normal pregnancy, antepartum Client doing well at this time. Client denies any additional questions or concerns at this time. OB flow sheet questions reviewed. Client verbalizes understanding and is in agreement with plan of care.  2. Abnormal glucose tolerance affecting pregnancy, antepartum Continue to monitor  Term labor symptoms and general obstetric precautions including but  not limited to vaginal bleeding, contractions, leaking of fluid and fetal movement were reviewed in detail with the patient.  I discussed the assessment and treatment plan with the patient. The patient was provided an opportunity to ask questions and all were answered. The patient agreed with the plan and demonstrated an understanding of the instructions. The patient was advised to call back or seek an in-person office evaluation/go to the hospital for any urgent or concerning symptoms.  Please refer to After Visit Summary for other counseling recommendations.   I provided 10 minutes of non-face-to-face time during this encounter.  Return in about 1 week (around 10/23/2018) for routine prenatal care.  Future Appointments  Date Time Provider [place]  10/24/2018 10:00 AM [facility] PROVIDER [facility] None    Berniece Andreas, NP

## 2018-10-24 ENCOUNTER — Other Ambulatory Visit: Payer: Self-pay

## 2018-10-24 ENCOUNTER — Ambulatory Visit: Payer: Self-pay | Admitting: Nurse Practitioner

## 2018-10-24 DIAGNOSIS — Z3480 Encounter for supervision of other normal pregnancy, unspecified trimester: Secondary | ICD-10-CM

## 2018-10-24 NOTE — Progress Notes (Signed)
In for visit; has [facility] pager# Debera Lat, RN

## 2018-10-24 NOTE — Progress Notes (Signed)
   PRENATAL VISIT NOTE  Subjective:  Jenna Daniel is a 33 y.o. G4P2003 at [redacted]w[redacted]d being seen today for ongoing prenatal care.  She is currently monitored for the following issues for this low-risk pregnancy and has Supervision of other normal pregnancy, antepartum; Placenta previa antepartum; Abnormal glucose tolerance affecting pregnancy, antepartum; Blood type, Rh positive; and Overweight (BMI 25.0-29.9) on their problem list.  Patient reports no complaints.  Contractions: Not present. Vag. Bleeding: None.  Movement: Present. Denies leaking of fluid/ROM.   The following portions of the patient's history were reviewed and updated as appropriate: allergies, current medications, past family history, past medical history, past social history, past surgical history and problem list. Problem list updated.  Objective:   Vitals:   10/24/18 0943  BP: 105/73  Temp: 97.8 F (36.6 C)  Weight: 193 lb 12.8 oz (87.9 kg)    Fetal Status: Fetal Heart Rate (bpm): 138 Fundal Height: 39 cm Movement: Present  Presentation: Vertex  General:  Alert, oriented and cooperative. Patient is in no acute distress.  Skin: Skin is warm and dry. No rash noted.   Cardiovascular: Normal heart rate noted  Respiratory: Normal respiratory effort, no problems with respiration noted  Abdomen: Soft, gravid, appropriate for gestational age.  Pain/Pressure: Absent     Pelvic: Cervical exam performed Dilation: Fingertip Effacement (%): Thick  IOL form completed for [facility] - referral to be completed as well  Extremities: Normal range of motion.  Edema: None  Mental Status: Normal mood and affect. Normal behavior. Normal judgment and thought content.   Assessment and Plan:  Pregnancy: G4P2003 at [redacted]w[redacted]d  1. Supervision of other normal pregnancy, antepartum Client admits to doing well IOL form completed for [facility] - client informed that she would be notified of date/time of appointment    Term labor symptoms and general  obstetric precautions including but not limited to vaginal bleeding, contractions, leaking of fluid and fetal movement were reviewed in detail with the patient. Please refer to After Visit Summary for other counseling recommendations.  No follow-ups on file.  Future Appointments  Date Time Provider [place]  10/31/2018  2:00 PM [facility] PROVIDER [facility] None    Berniece Andreas, NP

## 2018-10-30 ENCOUNTER — Ambulatory Visit: Payer: Self-pay

## 2018-10-31 ENCOUNTER — Ambulatory Visit: Payer: Self-pay | Admitting: Nurse Practitioner

## 2018-10-31 ENCOUNTER — Other Ambulatory Visit: Payer: Self-pay

## 2018-10-31 VITALS — BP 115/80 | Temp 99.3°F | Wt 196.4 lb

## 2018-10-31 DIAGNOSIS — O99810 Abnormal glucose complicating pregnancy: Secondary | ICD-10-CM

## 2018-10-31 DIAGNOSIS — Z6790 Unspecified blood type, Rh positive: Secondary | ICD-10-CM

## 2018-10-31 DIAGNOSIS — O4290 Premature rupture of membranes, unspecified as to length of time between rupture and onset of labor, unspecified weeks of gestation: Secondary | ICD-10-CM

## 2018-10-31 DIAGNOSIS — Z3480 Encounter for supervision of other normal pregnancy, unspecified trimester: Secondary | ICD-10-CM

## 2018-10-31 DIAGNOSIS — O4400 Placenta previa specified as without hemorrhage, unspecified trimester: Secondary | ICD-10-CM

## 2018-10-31 NOTE — Progress Notes (Signed)
Possible ROM X 24 hrs   PRENATAL VISIT NOTE  Subjective:  Jenna Daniel is a 33 y.o. G4P2003 at [redacted]w[redacted]d being seen today for ongoing prenatal care.  She is currently monitored for the following issues for this high-risk pregnancy and has Supervision of other normal pregnancy, antepartum; Placenta previa antepartum; Abnormal glucose tolerance affecting pregnancy, antepartum; Blood type, Rh positive; and Overweight (BMI 25.0-29.9) on their problem list.  Patient reports possible SROM.   Marland Kitchen  .   Lubertha Basque to possible leaking of fluid/ROM.   The following portions of the patient's history were reviewed and updated as appropriate: allergies, current medications, past family history, past medical history, past social history, past surgical history and problem list. Problem list updated.  Objective:   Vitals:   10/31/18 1429  BP: 115/80  Temp: 99.3 F (37.4 C)  Weight: 196 lb 6.4 oz (89.1 kg)    Fetal Status:           General:  Alert, oriented and cooperative. Patient is in no acute distress.  Skin: Skin is warm and dry. No rash noted.   Cardiovascular: Normal heart rate noted  Respiratory: Normal respiratory effort, no problems with respiration noted  Abdomen: Soft, gravid, appropriate for gestational age.        Pelvic: Sterile Metal Speculum inserted - pooling noted - clear fluid - ferning noted        Extremities: Normal range of motion.     Mental Status: Normal mood and affect. Normal behavior. Normal judgment and thought content.   Assessment and Plan:  Pregnancy: G4P2003 at [redacted]w[redacted]d  1. Spontaneous rupture of membranes Pooling in speculum noted - clear fluid Ferning noted while visualized under microscope Advised client to go to [facility] ED Client verbalizes understanding and is in agreement with plan of care   1. Supervision of other normal pregnancy, antepartum Client with possible ROM     Term labor symptoms and general obstetric precautions including but not limited  to vaginal bleeding, contractions, leaking of fluid and fetal movement were reviewed in detail with the patient. Please refer to After Visit Summary for other counseling recommendations.  No follow-ups on file.  No future appointments.  Berniece Andreas, NP

## 2018-10-31 NOTE — Progress Notes (Signed)
Patient here for maternity revisit at 40 0/7 wks. States she is having some clear, watery discharge, denies odor.Jenetta Downer, RN

## 2018-11-02 MED ORDER — IBUPROFEN 600 MG PO TABS
600.00 | ORAL_TABLET | ORAL | Status: DC
Start: 2018-11-02 — End: 2018-11-02

## 2018-11-02 MED ORDER — LACTATED RINGERS IV SOLN
125.00 | INTRAVENOUS | Status: DC
Start: ? — End: 2018-11-02

## 2018-11-02 MED ORDER — DOCUSATE SODIUM 100 MG PO CAPS
100.00 | ORAL_CAPSULE | ORAL | Status: DC
Start: 2018-11-02 — End: 2018-11-02

## 2018-11-02 MED ORDER — ONDANSETRON 4 MG PO TBDP
4.00 | ORAL_TABLET | ORAL | Status: DC
Start: ? — End: 2018-11-02

## 2018-11-02 MED ORDER — ONDANSETRON HCL 4 MG/2ML IJ SOLN
4.00 | INTRAMUSCULAR | Status: DC
Start: ? — End: 2018-11-02

## 2018-11-02 MED ORDER — PNV PRENATAL PLUS MULTIVITAMIN 27-1 MG PO TABS
1.00 | ORAL_TABLET | ORAL | Status: DC
Start: 2018-11-03 — End: 2018-11-02

## 2018-11-02 MED ORDER — DIPHENHYDRAMINE HCL 25 MG PO CAPS
25.00 | ORAL_CAPSULE | ORAL | Status: DC
Start: ? — End: 2018-11-02

## 2018-11-02 MED ORDER — OXYTOCIN-SODIUM CHLORIDE 30-0.9 UT/500ML-% IV SOLN
62.00 | INTRAVENOUS | Status: DC
Start: ? — End: 2018-11-02

## 2018-11-02 MED ORDER — DIPHENHYDRAMINE HCL 50 MG/ML IJ SOLN
25.00 | INTRAMUSCULAR | Status: DC
Start: ? — End: 2018-11-02

## 2018-11-02 MED ORDER — GENERIC EXTERNAL MEDICATION
Status: DC
Start: ? — End: 2018-11-02

## 2018-11-02 MED ORDER — OXYTOCIN 10 UNIT/ML IJ SOLN
10.00 | INTRAMUSCULAR | Status: DC
Start: ? — End: 2018-11-02

## 2018-11-02 MED ORDER — ACETAMINOPHEN 325 MG PO TABS
650.00 | ORAL_TABLET | ORAL | Status: DC
Start: ? — End: 2018-11-02

## 2018-11-24 ENCOUNTER — Other Ambulatory Visit: Payer: Self-pay

## 2018-11-24 ENCOUNTER — Ambulatory Visit (LOCAL_COMMUNITY_HEALTH_CENTER): Payer: Self-pay | Admitting: Dietician

## 2018-11-24 NOTE — Progress Notes (Signed)
Assessment: Ht:   63 in   Wt: 190   BMI:  Labs: NA Meds: NA MVI: PNV Dx: Equivocal 3 GTT Living Conditions:  Adequate refrigerator, stove Language:  English Time:   11:00 AM on  10/26/2018  Duration:  30 minutes  We discussed GDM and how she felt about the diagnosis. She reported that she normally eats 2 meals and 2 snacks daily. We discussed the importance of fruits/vegetables in her diet. No juice or soda and exercise.      24 Hour Recall/Diet Hx Breakfast:  Milk and bananas breakfast shake, cereal Lunch:  Eggs Snack:   Dinner:  Beef and potato soup Snack:  8:00 PM leftover soup  Diagnosis: Excessive CHO intake related to GDM as evidenced by elevated blood glucose levels.   Intervention:  1) Fruits/vegetables daily 2) No juice/soda 3) Exercise daily if possible  Monitoring/Evaluation:  Follow up after birth  Cherene Julian RDN, LDN 11/24/18

## 2018-12-21 NOTE — Addendum Note (Signed)
Addended by: Cletis Media on: 12/21/2018 10:38 AM   Modules accepted: Orders

## 2018-12-28 ENCOUNTER — Ambulatory Visit: Payer: Self-pay

## 2018-12-28 ENCOUNTER — Other Ambulatory Visit: Payer: Self-pay

## 2018-12-28 ENCOUNTER — Encounter: Payer: Self-pay | Admitting: Advanced Practice Midwife

## 2018-12-28 ENCOUNTER — Ambulatory Visit: Payer: Self-pay | Admitting: Advanced Practice Midwife

## 2018-12-28 DIAGNOSIS — Z30013 Encounter for initial prescription of injectable contraceptive: Secondary | ICD-10-CM

## 2018-12-28 DIAGNOSIS — Z3009 Encounter for other general counseling and advice on contraception: Secondary | ICD-10-CM

## 2018-12-28 NOTE — Progress Notes (Signed)
Post Partum Exam  Jenna Daniel is a 33 y.o.nonsmoker [phone] female who presents for a postpartum visit. She is 8 weeks postpartum following a spontaneous vaginal delivery after IOL for PROM on 11/01/18 F 8#11 with pp hemorrhage due to retained placenta, EBL: 824. I have fully reviewed the prenatal and intrapartum course. The delivery was at 40.0 gestational weeks.  Anesthesia: none. Postpartum course has been wnl . Baby's course has been wnl. Baby is feeding by both breast and bottle - formula. Bleeding no bleeding. Bowel function is normal. Bladder function is normal. Patient is not sexually active. Contraception method is abstinence. Mom helps her with her 4 kids.  Postpartum depression screening: Edinburgh Postnatal Depression Scale - 12/28/18 1624      Edinburgh Postnatal Depression Scale:  In the Past 7 Days   I have been able to laugh and see the funny side of things.  0    I have looked forward with enjoyment to things.  0    I have blamed myself unnecessarily when things went wrong.  0    I have been anxious or worried for no good reason.  0    I have felt scared or panicky for no good reason.  0    I have been so unhappy that I have had difficulty sleeping.  0    I have felt sad or miserable.  0    I have been so unhappy that I have been crying.  0    The thought of harming myself has occurred to me.  0        The following portions of the patient's history were reviewed and updated as appropriate: allergies, current medications, past family history, past medical history, past social history, past surgical history and problem list. Last pap smear done 04/14/18 and was Normal  Review of Systems Pertinent items are noted in HPI.  Denies pp coitus.  Objective:  BP 115/83   Ht 5\' 4"  (1.626 m)   Wt 171 lb 12.8 oz (77.9 kg)   LMP 12/24/2018 (Exact Date) Comment: normal  Breastfeeding Yes   BMI 29.49 kg/m   General:  alert   Breasts:  negative  Lungs: clear to  auscultation bilaterally  Heart:  regular rate and rhythm, S1, S2 normal, no murmur, click, rub or gallop  Abdomen: soft, non-tender; bowel sounds normal; no masses,  no organomegaly   Vulva:  normal  Vagina: normal vagina  Cervix:  multiparous appearance  Corpus: normal size, contour, position, consistency, mobility, non-tender  Adnexa:  normal adnexa  Rectal Exam: normal sphincter        Assessment:     postpartum exam done. Pap smear not done at today's visit.--  Plan:   1. Contraception: Depo-Provera injections--pt desires today.  DMPA 150 mg IM q 11-13 wks x 1 year.  Please counsel on need for backup condoms first week 2. Infant feeding:  patient is currently feeding with breast and formula.  If breastmilk feeding patient was given letter for employer to provide appropriate pumping time to express breastmilk.  3. Mood: EPDS is low risk. Reviewed resources and that mood sx in first year after pregnancy are considered related to pregnancy and to reach out for help at [facility] if needed. Discussed [facility] as link to care and availability of LCSW for counseling  4. Chronic Medical Conditions:   list chronic medical problems and follow up/management plan.   Patient given handout about PCP care in the community Given MVI  per family planning program  Follow up in: 11-13 wks . or as needed.

## 2018-12-28 NOTE — Progress Notes (Signed)
Ran out of PNV.

## 2018-12-29 LAB — HEMOGLOBIN, FINGERSTICK: Hemoglobin: 13.6 g/dL (ref 11.1–15.9)

## 2019-01-01 MED ORDER — MEDROXYPROGESTERONE ACETATE 150 MG/ML IM SUSP
150.0000 mg | Freq: Once | INTRAMUSCULAR | Status: AC
  Administered 2018-12-28: 11:00:00 150 mg via INTRAMUSCULAR

## 2019-01-01 NOTE — Progress Notes (Signed)
Hgb reviewed, no tx per standing order. Provider orders completed. 

## 2019-01-01 NOTE — Addendum Note (Signed)
Addended by: Doy Mince on: 01/01/2019 11:20 AM   Modules accepted: Orders

## 2019-03-28 ENCOUNTER — Telehealth: Payer: Self-pay | Admitting: Family Medicine

## 2019-03-28 ENCOUNTER — Other Ambulatory Visit: Payer: Self-pay

## 2019-03-28 DIAGNOSIS — E876 Hypokalemia: Secondary | ICD-10-CM | POA: Insufficient documentation

## 2019-03-28 DIAGNOSIS — R509 Fever, unspecified: Secondary | ICD-10-CM | POA: Insufficient documentation

## 2019-03-28 DIAGNOSIS — E871 Hypo-osmolality and hyponatremia: Secondary | ICD-10-CM | POA: Insufficient documentation

## 2019-03-28 DIAGNOSIS — N390 Urinary tract infection, site not specified: Secondary | ICD-10-CM | POA: Insufficient documentation

## 2019-03-28 DIAGNOSIS — R42 Dizziness and giddiness: Secondary | ICD-10-CM | POA: Insufficient documentation

## 2019-03-28 DIAGNOSIS — R1030 Lower abdominal pain, unspecified: Secondary | ICD-10-CM | POA: Insufficient documentation

## 2019-03-28 DIAGNOSIS — M549 Dorsalgia, unspecified: Secondary | ICD-10-CM | POA: Insufficient documentation

## 2019-03-28 DIAGNOSIS — R110 Nausea: Secondary | ICD-10-CM | POA: Insufficient documentation

## 2019-03-28 LAB — CBC WITH DIFFERENTIAL/PLATELET
Abs Immature Granulocytes: 0.05 10*3/uL (ref 0.00–0.07)
Basophils Absolute: 0.1 10*3/uL (ref 0.0–0.1)
Basophils Relative: 0 %
Eosinophils Absolute: 0.1 10*3/uL (ref 0.0–0.5)
Eosinophils Relative: 1 %
HCT: 35.6 % — ABNORMAL LOW (ref 36.0–46.0)
Hemoglobin: 12.4 g/dL (ref 12.0–15.0)
Immature Granulocytes: 0 %
Lymphocytes Relative: 17 %
Lymphs Abs: 2.5 10*3/uL (ref 0.7–4.0)
MCH: 28.7 pg (ref 26.0–34.0)
MCHC: 34.8 g/dL (ref 30.0–36.0)
MCV: 82.4 fL (ref 80.0–100.0)
Monocytes Absolute: 1.2 10*3/uL — ABNORMAL HIGH (ref 0.1–1.0)
Monocytes Relative: 8 %
Neutro Abs: 10.7 10*3/uL — ABNORMAL HIGH (ref 1.7–7.7)
Neutrophils Relative %: 74 %
Platelets: 169 10*3/uL (ref 150–400)
RBC: 4.32 MIL/uL (ref 3.87–5.11)
RDW: 13 % (ref 11.5–15.5)
WBC: 14.6 10*3/uL — ABNORMAL HIGH (ref 4.0–10.5)
nRBC: 0 % (ref 0.0–0.2)

## 2019-03-28 LAB — COMPREHENSIVE METABOLIC PANEL
ALT: 79 U/L — ABNORMAL HIGH (ref 0–44)
AST: 46 U/L — ABNORMAL HIGH (ref 15–41)
Albumin: 4 g/dL (ref 3.5–5.0)
Alkaline Phosphatase: 97 U/L (ref 38–126)
Anion gap: 13 (ref 5–15)
BUN: 9 mg/dL (ref 6–20)
CO2: 19 mmol/L — ABNORMAL LOW (ref 22–32)
Calcium: 8.6 mg/dL — ABNORMAL LOW (ref 8.9–10.3)
Chloride: 99 mmol/L (ref 98–111)
Creatinine, Ser: 0.8 mg/dL (ref 0.44–1.00)
GFR calc Af Amer: >60 mL/min (ref >60)
GFR calc non Af Amer: >60 mL/min (ref >60)
Glucose, Bld: 128 mg/dL — ABNORMAL HIGH (ref 70–99)
Potassium: 3 mmol/L — ABNORMAL LOW (ref 3.5–5.1)
Sodium: 131 mmol/L — ABNORMAL LOW (ref 135–145)
Total Bilirubin: 1.2 mg/dL (ref 0.3–1.2)
Total Protein: 8.6 g/dL — ABNORMAL HIGH (ref 6.5–8.1)

## 2019-03-28 LAB — URINALYSIS, COMPLETE (UACMP) WITH MICROSCOPIC
Bilirubin Urine: NEGATIVE
Glucose, UA: NEGATIVE mg/dL
Hgb urine dipstick: NEGATIVE
Ketones, ur: NEGATIVE mg/dL
Nitrite: NEGATIVE
Protein, ur: 30 mg/dL — AB
Specific Gravity, Urine: 1.008 (ref 1.005–1.030)
WBC, UA: >50 WBC/hpf — ABNORMAL HIGH (ref 0–5)
pH: 7 (ref 5.0–8.0)

## 2019-03-28 LAB — LACTIC ACID, PLASMA: Lactic Acid, Venous: 0.9 mmol/L (ref 0.5–1.9)

## 2019-03-28 LAB — POCT PREGNANCY, URINE: Preg Test, Ur: NEGATIVE

## 2019-03-28 NOTE — ED Triage Notes (Signed)
PT to ED for fever. States last week she had urinary symptoms but never got dx with UTI. Monday she started with fever, nausea, dizziness and back/abd pain. PT is sweaty and has temp of 100.4 at this time. No urinary symptoms at this time.

## 2019-03-28 NOTE — Telephone Encounter (Signed)
Patient has question about symptoms she is experiencing after having a UTI.

## 2019-03-28 NOTE — ED Notes (Signed)
Labs and first set of blood cultures sent to lab by Lattie Haw, EDT

## 2019-03-29 ENCOUNTER — Emergency Department
Admission: EM | Admit: 2019-03-29 | Discharge: 2019-03-29 | Disposition: A | Discharge status: Home / Self Care | Payer: Self-pay | Attending: Emergency Medicine | Admitting: Emergency Medicine

## 2019-03-29 DIAGNOSIS — N390 Urinary tract infection, site not specified: Secondary | ICD-10-CM

## 2019-03-29 DIAGNOSIS — E876 Hypokalemia: Secondary | ICD-10-CM

## 2019-03-29 DIAGNOSIS — E871 Hypo-osmolality and hyponatremia: Secondary | ICD-10-CM

## 2019-03-29 DIAGNOSIS — R509 Fever, unspecified: Secondary | ICD-10-CM

## 2019-03-29 MED ORDER — ONDANSETRON 4 MG PO TBDP: 4.0000 mg | ORAL_TABLET | Freq: Three times a day (TID) | ORAL | 0 refills | Status: DC | PRN

## 2019-03-29 MED ORDER — SODIUM CHLORIDE 0.9 % IV BOLUS
1000.0000 mL | Freq: Once | INTRAVENOUS | Status: AC
  Administered 2019-03-29: 1000 mL via INTRAVENOUS

## 2019-03-29 MED ORDER — POTASSIUM CHLORIDE CRYS ER 20 MEQ PO TBCR
40.0000 meq | EXTENDED_RELEASE_TABLET | Freq: Once | ORAL | Status: AC
  Administered 2019-03-29: 40 meq via ORAL
  Filled 2019-03-29: qty 2

## 2019-03-29 MED ORDER — CEPHALEXIN 500 MG PO CAPS: 500.0000 mg | ORAL_CAPSULE | Freq: Three times a day (TID) | ORAL | 0 refills | Status: DC

## 2019-03-29 MED ORDER — CEPHALEXIN 500 MG PO CAPS: 500.0000 mg | ORAL_CAPSULE | Freq: Three times a day (TID) | ORAL | 0 refills | Status: AC

## 2019-03-29 MED ORDER — SODIUM CHLORIDE 0.9 % IV SOLN
1.0000 g | INTRAVENOUS | Status: AC
  Administered 2019-03-29: 1 g via INTRAVENOUS
  Filled 2019-03-29: qty 10

## 2019-03-29 MED ORDER — ONDANSETRON 4 MG PO TBDP: 4.0000 mg | ORAL_TABLET | Freq: Three times a day (TID) | ORAL | 0 refills | Status: AC | PRN

## 2019-03-29 NOTE — ED Notes (Signed)
Pt resting in bed. Agreeable to wait for d/c until fluid bolus is finished running.

## 2019-03-29 NOTE — Discharge Instructions (Signed)
1.  Alternate Tylenol and ibuprofen every 4 hours as needed for fever greater than 100.4 F. 2.  Take antibiotic as prescribed (Keflex 500 mg 3 times daily x7 days). 3.  You may take Zofran as needed for nausea or vomiting. 4.  Return to the ER for worsening symptoms, persistent vomiting, difficulty breathing or other concerns.

## 2019-03-29 NOTE — ED Notes (Signed)
E-signature not working at this time. Pt verbalized understanding of D/C instructions, prescriptions and follow up care with no further questions at this time. Pt in NAD and ambulatory at time of D/C.  

## 2019-03-29 NOTE — ED Provider Notes (Signed)
[facility] Emergency Department Provider Note   ____________________________________________   First MD Initiated Contact with Patient 03/29/19 0125     (approximate)  I have reviewed the triage vital signs and the nursing notes.   HISTORY  Chief Complaint Fever and Nausea    HPI Nettie Elmora Zuniga Cambero is a 33 y.o. female who presents to the ED from home with a chief complaint of fever, nausea, dizziness, suprapubic and bilateral back pain.  Patient reports symptoms of UTI last week which he self treated with increasing fluids.  Developed fever 2 days ago with the above symptoms.  Denies cough, chest pain or shortness of breath.  Denies history of kidney stones.  Denies known COVID-19 exposure.       Past Medical History:  Diagnosis Date  . Urinary tract infection     Patient Active Problem List   Diagnosis Date Noted  . Overweight (BMI 25.0-29.9) 10/05/2018    Past Surgical History:  Procedure Laterality Date  . [facility]LECY[facility]  10 - 12 years [facility]    Prior to Admission medications   Medication Sig Start Date End Date Taking? Authorizing Provider  Prenatal Vit-Fe Fumarate-FA (PNV PRENATAL PLUS MULTIVITAMIN) 27-1 MG TABS Take by mouth.    [provider]    Allergies Patient has no known allergies.  Family History  Problem Relation Age of Onset  . Multiple births Mother   . Asthma Brother   . Diabetes Maternal Grandmother   . Cancer Maternal Grandmother   . Asthma Brother     Social History Social History   Tobacco Use  . Smoking status: Never Smoker  . Smokeless tobacco: Never Used  Substance Use Topics  . Alcohol use: No    Alcohol/week: 0.0 standard drinks  . Drug use: No    Review of Systems  Constitutional: Positive for fever Eyes: No visual changes. ENT: No sore throat. Cardiovascular: Denies chest pain. Respiratory: Denies shortness of breath. Gastrointestinal: No abdominal pain.  Positive for  nausea, no vomiting.  No diarrhea.  No constipation. Genitourinary: Positive for dysuria. Musculoskeletal: Positive for back pain. Skin: Negative for rash. Neurological: Negative for headaches, focal weakness or numbness.   ____________________________________________   PHYSICAL EXAM:  VITAL SIGNS: ED Triage Vitals  Enc Vitals Group     BP 03/28/19 2024 121/72     Pulse Rate 03/28/19 2024 (!) 132     Resp 03/28/19 2024 20     Temp 03/28/19 2024 (!) 100.4 F (38 C)     Temp Source 03/28/19 2024 Oral     SpO2 03/28/19 2024 97 %     Weight 03/28/19 2025 170 lb (77.1 kg)     Height 03/28/19 2025 5\' 4"  (1.626 m)     Head Circumference --      Peak Flow --      Pain Score 03/28/19 2025 8     Pain Loc --      Pain Edu? --      Excl. in GC? --     Constitutional: Alert and oriented. Well appearing and in no acute distress. Eyes: Conjunctivae are normal. PERRL. EOMI. Head: Atraumatic. Nose: No congestion/rhinnorhea. Mouth/Throat: Mucous membranes are moist.  Oropharynx non-erythematous. Neck: No stridor.   Cardiovascular: Normal rate, regular rhythm. Grossly normal heart sounds.  Good peripheral circulation. Respiratory: Normal respiratory effort.  No retractions. Lungs CTAB. Gastrointestinal: Soft and nontender to light or deep palpation. No distention. No abdominal bruits. No CVA tenderness. Musculoskeletal:  No lower extremity tenderness nor edema.  No joint effusions. Neurologic:  Normal speech and language. No gross focal neurologic deficits are appreciated. No gait instability. Skin:  Skin is warm, dry and intact. No rash noted. Psychiatric: Mood and affect are normal. Speech and behavior are normal.  ____________________________________________   LABS (all labs ordered are listed, but only abnormal results are displayed)  Labs Reviewed  COMPREHENSIVE METABOLIC PANEL - Abnormal; Notable for the following components:      Result Value   Sodium 131 (*)    Potassium  3.0 (*)    CO2 19 (*)    Glucose, Bld 128 (*)    Calcium 8.6 (*)    Total Protein 8.6 (*)    AST 46 (*)    ALT 79 (*)    All other components within normal limits  CBC WITH DIFFERENTIAL/PLATELET - Abnormal; Notable for the following components:   WBC 14.6 (*)    HCT 35.6 (*)    Neutro Abs 10.7 (*)    Monocytes Absolute 1.2 (*)    All other components within normal limits  URINALYSIS, COMPLETE (UACMP) WITH MICROSCOPIC - Abnormal; Notable for the following components:   Color, Urine YELLOW (*)    APPearance HAZY (*)    Protein, ur 30 (*)    Leukocytes,Ua LARGE (*)    WBC, UA >50 (*)    Bacteria, UA FEW (*)    All other components within normal limits  URINE CULTURE  LACTIC ACID, PLASMA  POC URINE PREG, ED  POCT PREGNANCY, URINE   ____________________________________________  EKG  None ____________________________________________  RADIOLOGY  ED MD interpretation: None  Official radiology report(s): No results found.  ____________________________________________   PROCEDURES  Procedure(s) performed (including Critical Care):  Procedures   ____________________________________________   INITIAL IMPRESSION / ASSESSMENT AND PLAN / ED COURSE  As part of my medical decision making, I reviewed the following data within the [facility] notes reviewed and incorporated, Labs reviewed and Notes from prior ED visits     Topaz Raglin was evaluated in Emergency Department on 03/29/2019 for the symptoms described in the history of present illness. She was evaluated in the context of the global COVID-19 pandemic, which necessitated consideration that the patient might be at risk for infection with the SARS-CoV-2 virus that causes COVID-19. Institutional protocols and algorithms that pertain to the evaluation of patients at risk for COVID-19 are in a state of rapid change based on information released by regulatory bodies including the CDC and  federal and state organizations. These policies and algorithms were followed during the patient's care in the ED.    33 year old female who presents with fever, nausea, back pain; dysuria last week and treated with antibiotic. Differential diagnosis includes, but is not limited to, ovarian cyst, ovarian torsion, acute appendicitis, diverticulitis, urinary tract infection/pyelonephritis, endometriosis, bowel obstruction, colitis, renal colic, gastroenteritis, hernia, fibroids, endometriosis, pregnancy related pain including ectopic pregnancy, etc.  Laboratory results remarkable for moderate leukocytosis, hyponatremia, hypokalemia, leukocyte positive UA with > 50 WBC.  Lactate is normal.  Will administer IV fluids, IV antibiotic.  Will discharge home on Keflex and Zofran to use as needed.  Strict return precautions given.  Patient verbalizes understanding and agrees with plan of care.      ____________________________________________   FINAL CLINICAL IMPRESSION(S) / ED DIAGNOSES  Final diagnoses:  Fever, unspecified fever cause  Urinary tract infection without hematuria, site unspecified  Hypokalemia  Hyponatremia     ED Discharge  Orders    None       Note:  This document was prepared using Dragon voice recognition software and may include unintentional dictation errors.   Irean Hong, MD 03/29/19 [phone]

## 2019-03-31 LAB — URINE CULTURE: Culture: 40000 — AB

## 2019-04-03 NOTE — Telephone Encounter (Signed)
TC with patient.  Went to ER this weekend and was given meds for UTI. Reports feeling better.  Co to establish care at [facility] or [facility] for PCP services.  Patient verbalized understanding. Aileen Fass, RN

## 2019-04-18 ENCOUNTER — Ambulatory Visit (LOCAL_COMMUNITY_HEALTH_CENTER): Payer: Self-pay

## 2019-04-18 ENCOUNTER — Other Ambulatory Visit: Payer: Self-pay

## 2019-04-18 VITALS — Ht 64.0 in | Wt 167.5 lb

## 2019-04-18 DIAGNOSIS — Z3042 Encounter for surveillance of injectable contraceptive: Secondary | ICD-10-CM

## 2019-04-18 DIAGNOSIS — Z30013 Encounter for initial prescription of injectable contraceptive: Secondary | ICD-10-CM

## 2019-04-18 DIAGNOSIS — Z3009 Encounter for other general counseling and advice on contraception: Secondary | ICD-10-CM

## 2019-04-18 MED ORDER — MEDROXYPROGESTERONE ACETATE 150 MG/ML IM SUSP
150.0000 mg | Freq: Once | INTRAMUSCULAR | Status: AC
  Administered 2019-04-18: 150 mg via INTRAMUSCULAR

## 2019-04-18 NOTE — Progress Notes (Signed)
15.6 weeks post depo today. DMPA 150 mg IM administered per Arnetha Courser, CNM order dated 12/28/2018 at Post Partum.

## 2019-07-13 ENCOUNTER — Other Ambulatory Visit: Payer: Self-pay

## 2019-07-13 ENCOUNTER — Ambulatory Visit (LOCAL_COMMUNITY_HEALTH_CENTER): Payer: Self-pay

## 2019-07-13 VITALS — BP 114/81 | Ht 64.0 in | Wt 174.0 lb

## 2019-07-13 DIAGNOSIS — Z3009 Encounter for other general counseling and advice on contraception: Secondary | ICD-10-CM

## 2019-07-13 DIAGNOSIS — Z3042 Encounter for surveillance of injectable contraceptive: Secondary | ICD-10-CM

## 2019-07-13 DIAGNOSIS — Z30013 Encounter for initial prescription of injectable contraceptive: Secondary | ICD-10-CM

## 2019-07-13 MED ORDER — MEDROXYPROGESTERONE ACETATE 150 MG/ML IM SUSP
150.0000 mg | Freq: Once | INTRAMUSCULAR | Status: AC
  Administered 2019-07-13: 10:00:00 150 mg via INTRAMUSCULAR

## 2019-07-13 NOTE — Progress Notes (Signed)
12.2 weeks post depo today.  DMPA 150 mg IM administered today per Arnetha Courser, CNM order dated 12/28/2018 (1 year depo order).

## 2019-09-16 IMAGING — US US OB COMP LESS 14 WK
1 series · 14 of 28 positions shown · non-contrast
Comparison: None.

CLINICAL DATA: Pelvic pain and vaginal bleeding for 12 hours.

EXAM:
OBSTETRIC <14 WK ULTRASOUND
TECHNIQUE: Transabdominal ultrasound was performed for evaluation of the
gestation as well as the maternal uterus and adnexal regions.

[Series 1: us ob comp less 14 wk · 0.15mm/px · 14 of 49 slices shown]
[im 2/49]
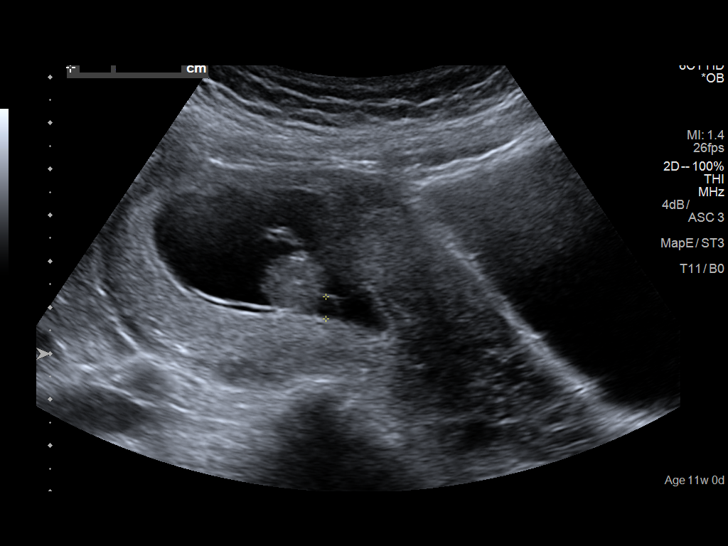
[im 6/49]
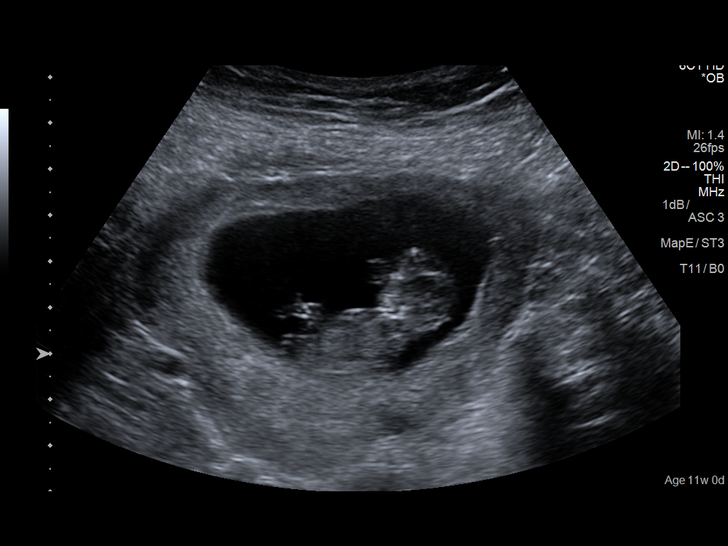
[im 9/49]
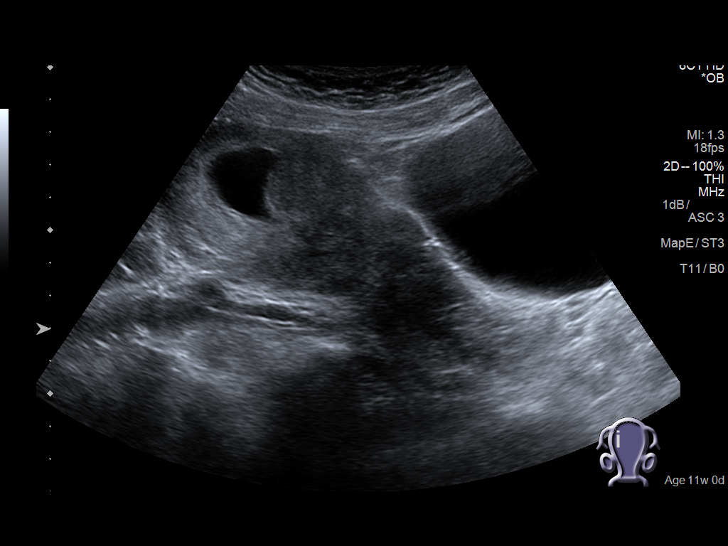
[im 13/49]
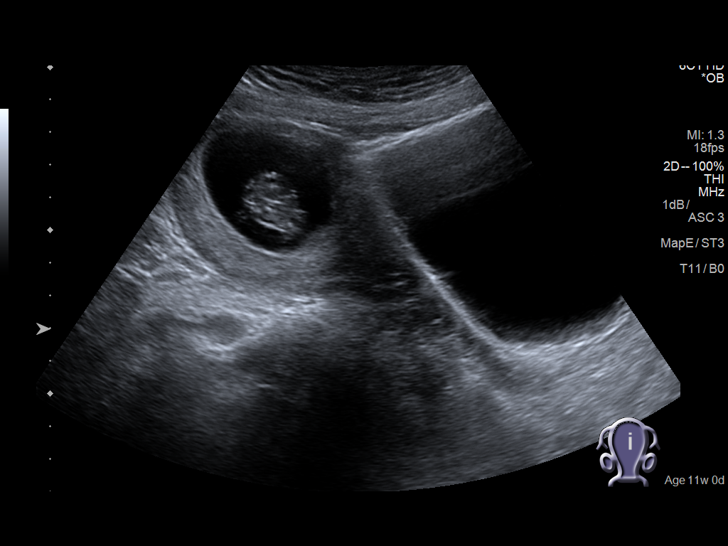
[im 17/49]
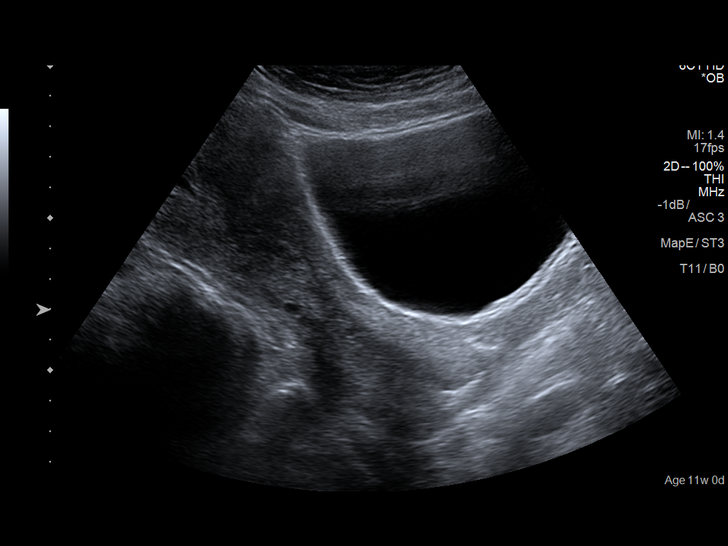
[im 20/49]
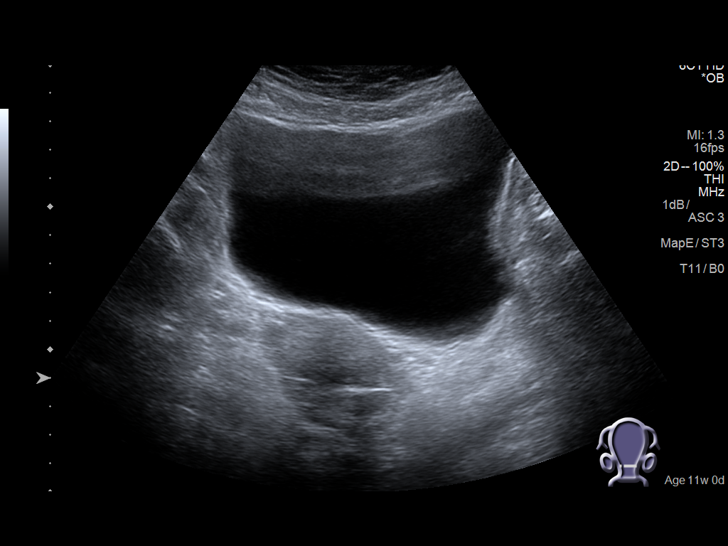
[im 24/49]
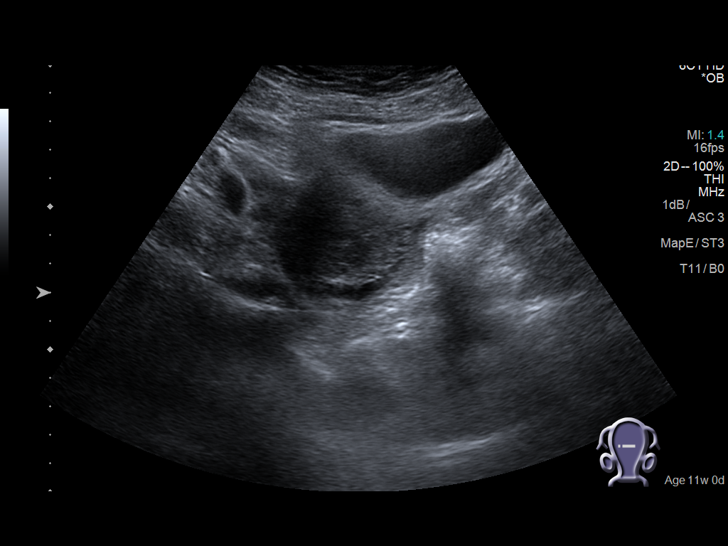
[im 27/49]
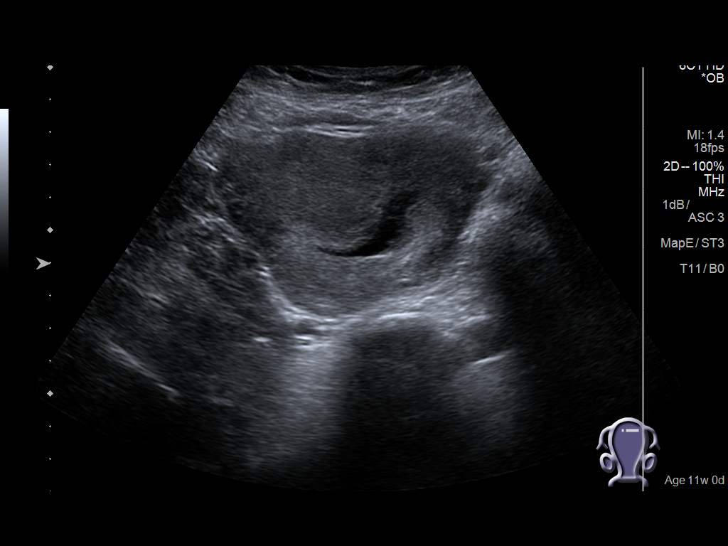
[im 31/49]
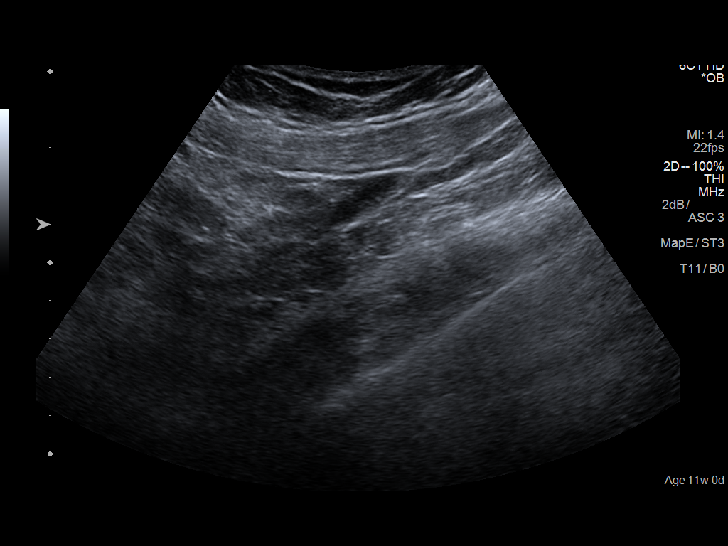
[im 34/49]
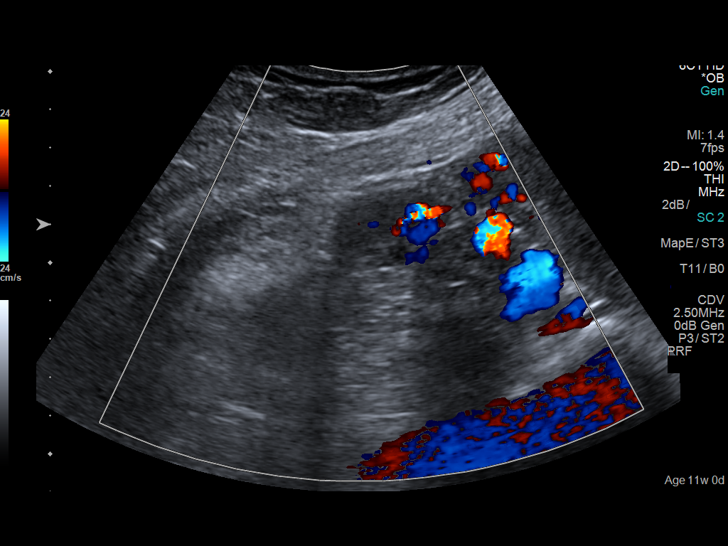
[im 38/49]
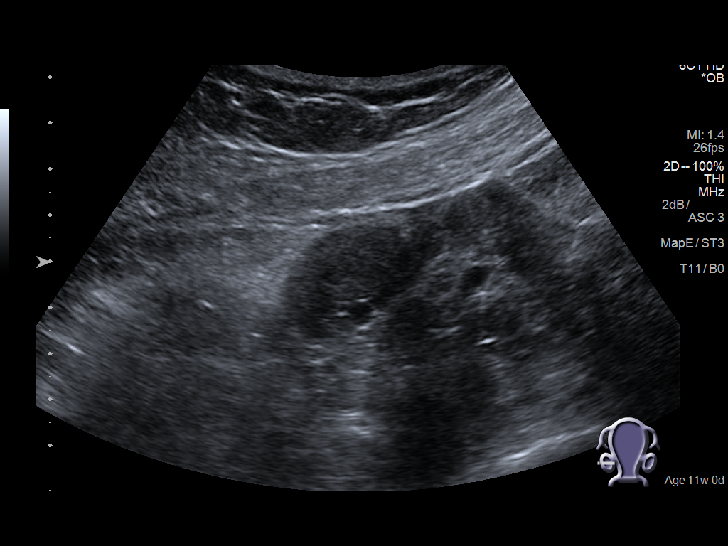
[im 41/49]
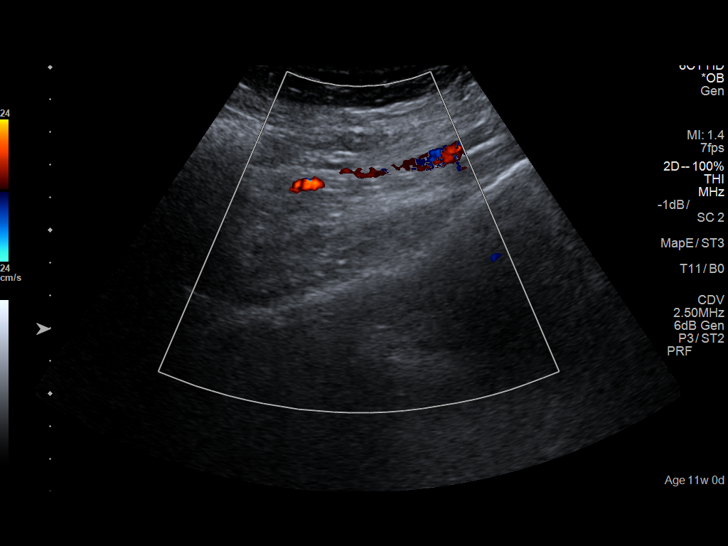
[im 45/49]
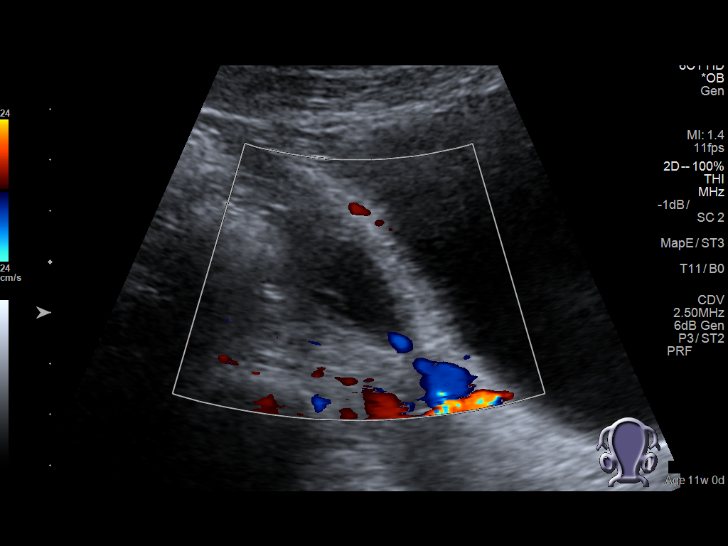
[im 49/49]
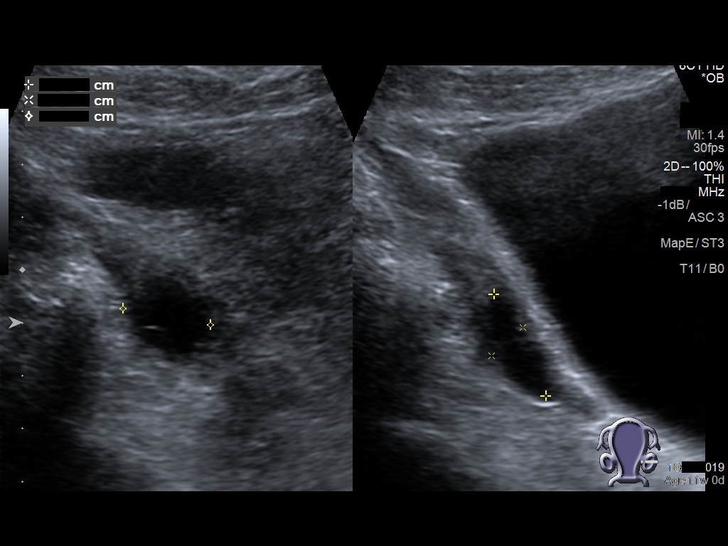

[14 of 28 positions shown; findings below may reference images not displayed]

FINDINGS: Intrauterine gestational sac: Single

Yolk sac:  Visualized.

Embryo:  Visualized.

Cardiac Activity: Visualized.

Heart Rate: 171 bpm

CRL:   41 mm   11 w 0 d                  US EDC: 10/31/2018

Subchorionic hemorrhage:  None visualized.

Maternal uterus/adnexae: Normal appearance of both ovaries. No mass
or abnormal free fluid identified.
IMPRESSION: Single living IUP measuring 11 weeks 0 days, with US EDC of
10/31/2018.

No significant maternal uterine or adnexal abnormality identified.

## 2019-10-01 ENCOUNTER — Ambulatory Visit: Payer: Self-pay

## 2019-10-13 ENCOUNTER — Ambulatory Visit: Payer: Self-pay | Attending: Internal Medicine

## 2019-10-13 DIAGNOSIS — Z23 Encounter for immunization: Secondary | ICD-10-CM

## 2019-10-13 NOTE — Progress Notes (Signed)
° °  Covid-19 Vaccination Clinic  Name:  Jenna Daniel    MRN: [id number] DOB: [birth year]  10/13/2019  Ms. Jenna Daniel was observed post Covid-19 immunization for 15 minutes without incident. She was provided with Vaccine Information Sheet and instruction to access the V-Safe system.   Ms. Jenna Daniel was instructed to call 911 with any severe reactions post vaccine:  Difficulty breathing   Swelling of face and throat   A fast heartbeat   A bad rash all over body   Dizziness and weakness   Immunizations Administered    Name Date Dose VIS Date Route   Pfizer COVID-19 Vaccine 10/13/2019 12:14 PM 0.3 mL 05/30/2018 Intramuscular   Manufacturer: [employer], [employer]   Lot: [id number]   NDC: [address]-8
